# Patient Record
Sex: Female | Born: 1980
Health system: Southern US, Community
[De-identification: ages and names within clinical notes are randomized; demographics above are authoritative.]

## PROBLEM LIST (undated history)

## (undated) DIAGNOSIS — E282 Polycystic ovarian syndrome: Secondary | ICD-10-CM

## (undated) DIAGNOSIS — F419 Anxiety disorder, unspecified: Secondary | ICD-10-CM

## (undated) DIAGNOSIS — R42 Dizziness and giddiness: Secondary | ICD-10-CM

## (undated) DIAGNOSIS — R7989 Other specified abnormal findings of blood chemistry: Secondary | ICD-10-CM

## (undated) DIAGNOSIS — F329 Major depressive disorder, single episode, unspecified: Secondary | ICD-10-CM

## (undated) DIAGNOSIS — M549 Dorsalgia, unspecified: Secondary | ICD-10-CM

## (undated) DIAGNOSIS — F32A Depression, unspecified: Secondary | ICD-10-CM

## (undated) DIAGNOSIS — J302 Other seasonal allergic rhinitis: Secondary | ICD-10-CM

## (undated) DIAGNOSIS — R7303 Prediabetes: Secondary | ICD-10-CM

## (undated) DIAGNOSIS — E669 Obesity, unspecified: Secondary | ICD-10-CM

## (undated) DIAGNOSIS — I10 Essential (primary) hypertension: Secondary | ICD-10-CM

## (undated) DIAGNOSIS — T7840XA Allergy, unspecified, initial encounter: Secondary | ICD-10-CM

## (undated) HISTORY — PX: MULTIPLE TOOTH EXTRACTIONS: SHX2053

## (undated) HISTORY — PX: WISDOM TOOTH EXTRACTION: SHX21

## (undated) HISTORY — DX: Depression, unspecified: F32.A

## (undated) HISTORY — DX: Other specified abnormal findings of blood chemistry: R79.89

## (undated) HISTORY — DX: Obesity, unspecified: E66.9

## (undated) HISTORY — DX: Major depressive disorder, single episode, unspecified: F32.9

## (undated) HISTORY — DX: Prediabetes: R73.03

## (undated) HISTORY — DX: Allergy, unspecified, initial encounter: T78.40XA

## (undated) HISTORY — DX: Anxiety disorder, unspecified: F41.9

## (undated) HISTORY — DX: Dizziness and giddiness: R42

## (undated) HISTORY — PX: OTHER SURGICAL HISTORY: SHX169

## (undated) HISTORY — DX: Polycystic ovarian syndrome: E28.2

---
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
## 1999-04-08 ENCOUNTER — Encounter: Admission: RE | Admit: 1999-04-08 | Discharge: 1999-04-08 | Payer: Self-pay | Admitting: Family Medicine

## 1999-04-29 ENCOUNTER — Encounter: Admission: RE | Admit: 1999-04-29 | Discharge: 1999-04-29 | Payer: Self-pay | Admitting: Family Medicine

## 1999-05-16 ENCOUNTER — Encounter (INDEPENDENT_AMBULATORY_CARE_PROVIDER_SITE_OTHER): Payer: Self-pay | Admitting: *Deleted

## 1999-05-16 LAB — CONVERTED CEMR LAB

## 1999-05-20 ENCOUNTER — Encounter: Admission: RE | Admit: 1999-05-20 | Discharge: 1999-05-20 | Payer: Self-pay | Admitting: Family Medicine

## 1999-10-22 ENCOUNTER — Encounter: Admission: RE | Admit: 1999-10-22 | Discharge: 1999-10-22 | Payer: Self-pay | Admitting: Family Medicine

## 1999-10-29 ENCOUNTER — Encounter: Admission: RE | Admit: 1999-10-29 | Discharge: 1999-10-29 | Payer: Self-pay | Admitting: Family Medicine

## 1999-12-30 ENCOUNTER — Encounter: Admission: RE | Admit: 1999-12-30 | Discharge: 1999-12-30 | Payer: Self-pay | Admitting: Family Medicine

## 2000-02-17 ENCOUNTER — Encounter: Admission: RE | Admit: 2000-02-17 | Discharge: 2000-02-17 | Payer: Self-pay | Admitting: Family Medicine

## 2000-04-08 ENCOUNTER — Emergency Department (HOSPITAL_COMMUNITY): Admission: EM | Admit: 2000-04-08 | Discharge: 2000-04-08 | Payer: Self-pay | Admitting: Emergency Medicine

## 2000-06-23 ENCOUNTER — Emergency Department (HOSPITAL_COMMUNITY): Admission: EM | Admit: 2000-06-23 | Discharge: 2000-06-23 | Payer: Self-pay | Admitting: Emergency Medicine

## 2000-07-08 ENCOUNTER — Encounter: Admission: RE | Admit: 2000-07-08 | Discharge: 2000-07-08 | Payer: Self-pay | Admitting: Family Medicine

## 2000-07-19 ENCOUNTER — Encounter: Admission: RE | Admit: 2000-07-19 | Discharge: 2000-07-19 | Payer: Self-pay | Admitting: Family Medicine

## 2000-08-02 ENCOUNTER — Encounter: Admission: RE | Admit: 2000-08-02 | Discharge: 2000-08-02 | Payer: Self-pay | Admitting: Family Medicine

## 2000-08-10 ENCOUNTER — Encounter: Admission: RE | Admit: 2000-08-10 | Discharge: 2000-08-10 | Payer: Self-pay | Admitting: Family Medicine

## 2000-08-15 ENCOUNTER — Encounter: Payer: Self-pay | Admitting: Family Medicine

## 2000-08-15 ENCOUNTER — Encounter: Admission: RE | Admit: 2000-08-15 | Discharge: 2000-08-15 | Payer: Self-pay | Admitting: Family Medicine

## 2000-08-30 ENCOUNTER — Encounter: Admission: RE | Admit: 2000-08-30 | Discharge: 2000-08-30 | Payer: Self-pay | Admitting: Family Medicine

## 2000-11-28 ENCOUNTER — Encounter: Admission: RE | Admit: 2000-11-28 | Discharge: 2000-12-15 | Payer: Self-pay | Admitting: Orthopedic Surgery

## 2001-01-09 ENCOUNTER — Other Ambulatory Visit: Admission: RE | Admit: 2001-01-09 | Discharge: 2001-01-09 | Payer: Self-pay | Admitting: Obstetrics and Gynecology

## 2001-03-19 ENCOUNTER — Emergency Department (HOSPITAL_COMMUNITY): Admission: EM | Admit: 2001-03-19 | Discharge: 2001-03-19 | Payer: Self-pay | Admitting: Emergency Medicine

## 2001-03-20 ENCOUNTER — Emergency Department (HOSPITAL_COMMUNITY): Admission: EM | Admit: 2001-03-20 | Discharge: 2001-03-20 | Payer: Self-pay | Admitting: Emergency Medicine

## 2001-08-31 ENCOUNTER — Encounter: Payer: Self-pay | Admitting: Obstetrics and Gynecology

## 2001-08-31 ENCOUNTER — Ambulatory Visit (HOSPITAL_COMMUNITY): Admission: RE | Admit: 2001-08-31 | Discharge: 2001-08-31 | Payer: Self-pay | Admitting: Obstetrics and Gynecology

## 2001-10-31 ENCOUNTER — Encounter: Admission: RE | Admit: 2001-10-31 | Discharge: 2002-01-29 | Payer: Self-pay | Admitting: Obstetrics and Gynecology

## 2001-12-06 ENCOUNTER — Inpatient Hospital Stay (HOSPITAL_COMMUNITY): Admission: AD | Admit: 2001-12-06 | Discharge: 2001-12-06 | Payer: Self-pay | Admitting: Obstetrics and Gynecology

## 2001-12-21 ENCOUNTER — Inpatient Hospital Stay (HOSPITAL_COMMUNITY): Admission: AD | Admit: 2001-12-21 | Discharge: 2001-12-23 | Payer: Self-pay | Admitting: Obstetrics and Gynecology

## 2002-01-24 ENCOUNTER — Other Ambulatory Visit: Admission: RE | Admit: 2002-01-24 | Discharge: 2002-01-24 | Payer: Self-pay | Admitting: Obstetrics and Gynecology

## 2002-03-28 ENCOUNTER — Emergency Department (HOSPITAL_COMMUNITY): Admission: EM | Admit: 2002-03-28 | Discharge: 2002-03-28 | Payer: Self-pay | Admitting: Emergency Medicine

## 2002-03-28 ENCOUNTER — Encounter: Payer: Self-pay | Admitting: Emergency Medicine

## 2003-03-07 ENCOUNTER — Other Ambulatory Visit: Admission: RE | Admit: 2003-03-07 | Discharge: 2003-03-07 | Payer: Self-pay | Admitting: Obstetrics and Gynecology

## 2003-07-08 ENCOUNTER — Emergency Department (HOSPITAL_COMMUNITY): Admission: EM | Admit: 2003-07-08 | Discharge: 2003-07-08 | Payer: Self-pay | Admitting: Emergency Medicine

## 2004-04-28 ENCOUNTER — Emergency Department (HOSPITAL_COMMUNITY): Admission: EM | Admit: 2004-04-28 | Discharge: 2004-04-28 | Payer: Self-pay | Admitting: Emergency Medicine

## 2004-06-26 ENCOUNTER — Other Ambulatory Visit: Admission: RE | Admit: 2004-06-26 | Discharge: 2004-06-26 | Payer: Self-pay | Admitting: Obstetrics and Gynecology

## 2005-02-21 ENCOUNTER — Emergency Department (HOSPITAL_COMMUNITY): Admission: EM | Admit: 2005-02-21 | Discharge: 2005-02-21 | Payer: Self-pay | Admitting: Family Medicine

## 2005-09-03 ENCOUNTER — Other Ambulatory Visit: Admission: RE | Admit: 2005-09-03 | Discharge: 2005-09-03 | Payer: Self-pay | Admitting: Obstetrics and Gynecology

## 2006-03-05 ENCOUNTER — Emergency Department (HOSPITAL_COMMUNITY): Admission: EM | Admit: 2006-03-05 | Discharge: 2006-03-05 | Payer: Self-pay | Admitting: Family Medicine

## 2006-06-08 ENCOUNTER — Emergency Department (HOSPITAL_COMMUNITY): Admission: EM | Admit: 2006-06-08 | Discharge: 2006-06-08 | Payer: Self-pay | Admitting: Family Medicine

## 2006-08-24 ENCOUNTER — Emergency Department (HOSPITAL_COMMUNITY): Admission: EM | Admit: 2006-08-24 | Discharge: 2006-08-24 | Payer: Self-pay | Admitting: Family Medicine

## 2006-12-24 ENCOUNTER — Emergency Department (HOSPITAL_COMMUNITY): Admission: EM | Admit: 2006-12-24 | Discharge: 2006-12-24 | Payer: Self-pay | Admitting: Family Medicine

## 2007-01-13 ENCOUNTER — Encounter (INDEPENDENT_AMBULATORY_CARE_PROVIDER_SITE_OTHER): Payer: Self-pay | Admitting: *Deleted

## 2007-05-03 ENCOUNTER — Emergency Department (HOSPITAL_COMMUNITY): Admission: EM | Admit: 2007-05-03 | Discharge: 2007-05-03 | Payer: Self-pay | Admitting: Family Medicine

## 2007-08-31 ENCOUNTER — Encounter: Admission: RE | Admit: 2007-08-31 | Discharge: 2007-08-31 | Payer: Self-pay | Admitting: Obstetrics and Gynecology

## 2007-10-27 ENCOUNTER — Encounter: Admission: RE | Admit: 2007-10-27 | Discharge: 2007-10-28 | Payer: Self-pay | Admitting: Obstetrics and Gynecology

## 2008-02-15 ENCOUNTER — Inpatient Hospital Stay (HOSPITAL_COMMUNITY): Admission: AD | Admit: 2008-02-15 | Discharge: 2008-02-15 | Payer: Self-pay | Admitting: Internal Medicine

## 2008-03-14 ENCOUNTER — Inpatient Hospital Stay (HOSPITAL_COMMUNITY): Admission: AD | Admit: 2008-03-14 | Discharge: 2008-03-14 | Payer: Self-pay | Admitting: Obstetrics and Gynecology

## 2011-05-24 ENCOUNTER — Emergency Department: Payer: Self-pay | Admitting: *Deleted

## 2011-07-07 ENCOUNTER — Encounter: Payer: Self-pay | Admitting: Family Medicine

## 2011-07-07 ENCOUNTER — Ambulatory Visit (INDEPENDENT_AMBULATORY_CARE_PROVIDER_SITE_OTHER): Payer: 59 | Admitting: Family Medicine

## 2011-07-07 DIAGNOSIS — R42 Dizziness and giddiness: Secondary | ICD-10-CM

## 2011-07-07 DIAGNOSIS — F419 Anxiety disorder, unspecified: Secondary | ICD-10-CM | POA: Insufficient documentation

## 2011-07-07 DIAGNOSIS — R030 Elevated blood-pressure reading, without diagnosis of hypertension: Secondary | ICD-10-CM | POA: Insufficient documentation

## 2011-07-07 DIAGNOSIS — E282 Polycystic ovarian syndrome: Secondary | ICD-10-CM

## 2011-07-07 DIAGNOSIS — J4520 Mild intermittent asthma, uncomplicated: Secondary | ICD-10-CM | POA: Insufficient documentation

## 2011-07-07 DIAGNOSIS — F411 Generalized anxiety disorder: Secondary | ICD-10-CM

## 2011-07-07 DIAGNOSIS — Z23 Encounter for immunization: Secondary | ICD-10-CM

## 2011-07-07 DIAGNOSIS — J45909 Unspecified asthma, uncomplicated: Secondary | ICD-10-CM

## 2011-07-07 MED ORDER — MECLIZINE HCL 25 MG PO TABS: 25.0000 mg | ORAL_TABLET | Freq: Three times a day (TID) | ORAL | Status: DC | PRN

## 2011-07-07 MED ORDER — ALBUTEROL SULFATE HFA 108 (90 BASE) MCG/ACT IN AERS: 2.0000 | INHALATION_SPRAY | Freq: Four times a day (QID) | RESPIRATORY_TRACT | Status: DC | PRN

## 2011-07-07 NOTE — Patient Instructions (Signed)
Great job on your [employer] loss!  Continue your exercise and good nutrition! We have a PhD nutritionist here in the office in our office.

## 2011-07-08 ENCOUNTER — Encounter: Payer: Self-pay | Admitting: Family Medicine

## 2011-07-08 NOTE — Assessment & Plan Note (Signed)
No evidence of serious etiology per history and her previous specialty evaluation.  Ok with intermittent meclizine.

## 2011-07-08 NOTE — Assessment & Plan Note (Signed)
Dr. Vincente Poli, obgyn.  Failed control of elevated testosterone with Metformin.  On Byetta.  No evidence of glucose intolerance or dyslipidemia.

## 2011-07-08 NOTE — Assessment & Plan Note (Signed)
Rare use of albuterol without exercise induced symptoms.  Ok to continue prn use.

## 2011-07-08 NOTE — Assessment & Plan Note (Addendum)
Has appointment with Dr. Dub Mikes for treatment resistant anxiety, panic attacks, depression.  Discussed I would be ok to refill clonazepam once if needed until able to be seen by psychiatry.  She will let me know.

## 2011-07-08 NOTE — Assessment & Plan Note (Signed)
Managing with weight loss.  Has had success with this.  Will continue to monitor.
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
## 2011-07-28 ENCOUNTER — Emergency Department (HOSPITAL_COMMUNITY)
Admission: EM | Admit: 2011-07-28 | Discharge: 2011-07-28 | Disposition: A | Discharge status: Home / Self Care | Payer: 59 | Attending: Emergency Medicine | Admitting: Emergency Medicine

## 2011-07-28 DIAGNOSIS — R51 Headache: Secondary | ICD-10-CM | POA: Insufficient documentation

## 2011-07-28 DIAGNOSIS — N390 Urinary tract infection, site not specified: Secondary | ICD-10-CM | POA: Insufficient documentation

## 2011-07-28 LAB — URINALYSIS, ROUTINE W REFLEX MICROSCOPIC
Glucose, UA: NEGATIVE mg/dL
Ketones, ur: NEGATIVE mg/dL
Nitrite: NEGATIVE
Specific Gravity, Urine: 1.011 (ref 1.005–1.030)
pH: 6 (ref 5.0–8.0)

## 2011-07-28 LAB — URINE MICROSCOPIC-ADD ON

## 2011-07-28 LAB — POCT PREGNANCY, URINE: Preg Test, Ur: NEGATIVE

## 2011-07-29 LAB — URINE CULTURE

## 2011-10-06 ENCOUNTER — Other Ambulatory Visit: Payer: Self-pay | Admitting: Family Medicine

## 2011-10-06 NOTE — Telephone Encounter (Signed)
Refill request

## 2011-10-13 ENCOUNTER — Ambulatory Visit (INDEPENDENT_AMBULATORY_CARE_PROVIDER_SITE_OTHER): Payer: 59 | Admitting: Family Medicine

## 2011-10-13 DIAGNOSIS — J329 Chronic sinusitis, unspecified: Secondary | ICD-10-CM | POA: Insufficient documentation

## 2011-10-13 DIAGNOSIS — R42 Dizziness and giddiness: Secondary | ICD-10-CM

## 2011-10-13 DIAGNOSIS — J309 Allergic rhinitis, unspecified: Secondary | ICD-10-CM

## 2011-10-13 MED ORDER — FLUTICASONE PROPIONATE 50 MCG/ACT NA SUSP: NASAL | Status: DC

## 2011-10-13 MED ORDER — MECLIZINE HCL 25 MG PO TABS: 25.0000 mg | ORAL_TABLET | Freq: Three times a day (TID) | ORAL | Status: DC | PRN

## 2011-10-13 MED ORDER — CETIRIZINE HCL 10 MG PO TABS: 10.0000 mg | ORAL_TABLET | Freq: Every day | ORAL | Status: DC

## 2011-10-13 NOTE — Patient Instructions (Signed)
Allergic Rhinitis Allergic rhinitis is when the mucous membranes in the nose respond to allergens. Allergens are particles in the air that cause your body to have an allergic reaction. This causes you to release allergic antibodies. Through a chain of events, these eventually cause you to release histamine into the blood stream (hence the use of antihistamines). Although meant to be protective to the body, it is this release that causes your discomfort, such as frequent sneezing, congestion and an itchy runny nose.  CAUSES  The pollen allergens may come from grasses, trees, and weeds. This is seasonal allergic rhinitis, or "hay fever." Other allergens cause year-round allergic rhinitis (perennial allergic rhinitis) such as house dust mite allergen, pet dander and mold spores.  SYMPTOMS   Nasal stuffiness (congestion).   Runny, itchy nose with sneezing and tearing of the eyes.   There is often an itching of the mouth, eyes and ears.  It cannot be cured, but it can be controlled with medications. DIAGNOSIS  If you are unable to determine the offending allergen, skin or blood testing may find it. TREATMENT   Avoid the allergen.   Medications and allergy shots (immunotherapy) can help.   Hay fever may often be treated with antihistamines in pill or nasal spray forms. Antihistamines block the effects of histamine. There are over-the-counter medicines that may help with nasal congestion and swelling around the eyes. Check with your caregiver before taking or giving this medicine.  If the treatment above does not work, there are many new medications your caregiver can prescribe. Stronger medications may be used if initial measures are ineffective. Desensitizing injections can be used if medications and avoidance fails. Desensitization is when a patient is given ongoing shots until the body becomes less sensitive to the allergen. Make sure you follow up with your caregiver if problems continue. SEEK  MEDICAL CARE IF:   You develop fever (more than 100.5 F (38.1 C).   You develop a cough that does not stop easily (persistent).   You have shortness of breath.   You start wheezing.   Symptoms interfere with normal daily activities.  Document Released: 07/27/2001 Document Revised: 07/14/2011 Document Reviewed: 02/05/2009 [facility] Patient Information 2012 [place], [place].  Sinusitis Sinuses are air pockets within the bones of your face. The growth of bacteria within a sinus leads to infection. The infection prevents the sinuses from draining. This infection is called sinusitis. SYMPTOMS  There will be different areas of pain depending on which sinuses have become infected.  The maxillary sinuses often produce pain beneath the eyes.   Frontal sinusitis may cause pain in the middle of the forehead and above the eyes.  Other problems (symptoms) include:  Toothaches.   Colored, pus-like (purulent) drainage from the nose.   Swelling, warmth, and tenderness over the sinus areas may be signs of infection.  TREATMENT  Sinusitis is most often determined by an exam.X-rays may be taken. If x-rays have been taken, make sure you obtain your results or find out how you are to obtain them. Your caregiver may give you medications (antibiotics). These are medications that will help kill the bacteria causing the infection. You may also be given a medication (decongestant) that helps to reduce sinus swelling.  HOME CARE INSTRUCTIONS   Only take over-the-counter or prescription medicines for pain, discomfort, or fever as directed by your caregiver.   Drink extra fluids. Fluids help thin the mucus so your sinuses can drain more easily.   Applying either moist heat or  ice packs to the sinus areas may help relieve discomfort.   Use saline nasal sprays to help moisten your sinuses. The sprays can be found at your local drugstore.  SEEK IMMEDIATE MEDICAL CARE IF:  You have a fever.   You have  increasing pain, severe headaches, or toothache.   You have nausea, vomiting, or drowsiness.   You develop unusual swelling around the face or trouble seeing.  MAKE SURE YOU:   Understand these instructions.   Will watch your condition.   Will get help right away if you are not doing well or get worse.  Document Released: 11/01/2005 Document Revised: 07/14/2011 Document Reviewed: 05/31/2007 [facility] Patient Information 2012 [place], [place].

## 2011-10-13 NOTE — Progress Notes (Signed)
  Subjective:    Patient ID: Amanda Massey, female    DOB: [birth year], 30 y.o.   MRN: [id number]  HPI Upper respiratory symptoms x3 weeks. Patient states she's had nasal congestion, rhinorrhea, postnasal drip, cough over last 3 weeks. Patient states that nasal congestion, postnasal drip, and sinus pressure have persisted over this time. Patient states she has a baseline history of allergies as well as intermittent nasal congestion that seems to worsen around the spring and fall. Patient states she's been using Mucinex with minimal relief in symptoms. Patient is a nonsmoker.  Patient also reports a baseline history of intermittent asthma to which patient uses when necessary albuterol for this. Patient states she's been using albuterol once twice daily over the same time. With minimal relief in cough or subjective wheezing.   Review of Systems See history of present illness, otherwise 12 point review systems negative.    Objective:   Physical Exam General: Up in bed NAD HEENT: Positive right ear cerumen impaction, tympanic membranes clear bilaterally status post cerumen removal, positive nasal erythema and mucosal edema, positive posterior oropharyngeal erythema. Cardiovascular: Regular rate and rhythm, no murmurs Pulmonary, clear to auscultation bilaterally, no wheezing auscultated.   Assessment & Plan:

## 2011-10-17 ENCOUNTER — Encounter: Payer: Self-pay | Admitting: Family Medicine

## 2011-10-17 NOTE — Assessment & Plan Note (Signed)
Acute flare of baseline vertigo by ear irrigation. Pt able to ambulate s/p irrigation and desires to go home.  Antivert rx refilled. Vestibular red flags reviewed.

## 2011-10-17 NOTE — Assessment & Plan Note (Signed)
Overall exam and hx consistent with allergic rhinitis exacerbated by underlying viral URI. Will treat with flonase and zyrtec. Infectious red flags discussed.

## 2011-11-21 ENCOUNTER — Emergency Department (HOSPITAL_COMMUNITY)
Admission: EM | Admit: 2011-11-21 | Discharge: 2011-11-21 | Disposition: A | Discharge status: Home / Self Care | Payer: 59 | Source: Home / Self Care | Attending: Family Medicine | Admitting: Family Medicine

## 2011-11-21 ENCOUNTER — Encounter (HOSPITAL_COMMUNITY): Payer: Self-pay | Admitting: *Deleted

## 2011-11-21 DIAGNOSIS — J45909 Unspecified asthma, uncomplicated: Secondary | ICD-10-CM

## 2011-11-21 MED ORDER — FLUTICASONE-SALMETEROL 100-50 MCG/DOSE IN AEPB: 1.0000 | INHALATION_SPRAY | Freq: Two times a day (BID) | RESPIRATORY_TRACT | Status: DC

## 2011-11-21 MED ORDER — METHYLPREDNISOLONE SODIUM SUCC 125 MG IJ SOLR
125.0000 mg | Freq: Once | INTRAMUSCULAR | Status: AC
  Administered 2011-11-21: 125 mg via INTRAMUSCULAR

## 2011-11-21 MED ORDER — METHYLPREDNISOLONE SODIUM SUCC 125 MG IJ SOLR
INTRAMUSCULAR | Status: AC
  Filled 2011-11-21: qty 2

## 2011-11-21 MED ORDER — ALBUTEROL SULFATE (5 MG/ML) 0.5% IN NEBU
5.0000 mg | INHALATION_SOLUTION | Freq: Once | RESPIRATORY_TRACT | Status: AC
  Administered 2011-11-21: 5 mg via RESPIRATORY_TRACT

## 2011-11-21 MED ORDER — ALBUTEROL SULFATE (5 MG/ML) 0.5% IN NEBU
INHALATION_SOLUTION | RESPIRATORY_TRACT | Status: AC
Start: 2011-11-21 — End: 2011-11-21
  Filled 2011-11-21: qty 1

## 2011-11-21 MED ORDER — IPRATROPIUM BROMIDE 0.02 % IN SOLN
0.5000 mg | Freq: Once | RESPIRATORY_TRACT | Status: AC
  Administered 2011-11-21: 0.5 mg via RESPIRATORY_TRACT

## 2011-11-21 MED ORDER — ALBUTEROL SULFATE HFA 108 (90 BASE) MCG/ACT IN AERS: 1.0000 | INHALATION_SPRAY | Freq: Four times a day (QID) | RESPIRATORY_TRACT | Status: DC | PRN

## 2011-11-21 NOTE — ED Notes (Signed)
Pre treatment peak flow 230  Post treatment 300

## 2011-11-21 NOTE — ED Notes (Signed)
Pt with onset of increased sob and wheezing last night - worse today - used alb;uterol inhaler without relief

## 2011-11-21 NOTE — ED Provider Notes (Signed)
History     CSN: [id number]  Arrival date & time 11/21/11  1718   First MD Initiated Contact with Patient 11/21/11 1719      Chief Complaint  Patient presents with  . Wheezing  . Shortness of Breath    (Consider location/radiation/quality/duration/timing/severity/associated sxs/prior treatment) Patient is a 31 y.o. female presenting with wheezing and shortness of breath. The history is provided by the patient and the spouse.  Wheezing  The current episode started yesterday. The problem has been gradually worsening. The problem is moderate. The symptoms are relieved by nothing. The symptoms are aggravated by nothing. Associated symptoms include shortness of breath and wheezing. Pertinent negatives include no fever and no cough. She has not inhaled smoke recently. Her past medical history is significant for asthma. She has been behaving normally.  Shortness of Breath  Associated symptoms include shortness of breath and wheezing. Pertinent negatives include no fever and no cough. Her past medical history is significant for asthma.    Past Medical History  Diagnosis Date  . PCOS (polycystic ovarian syndrome)   . Anxiety   . Asthma   . Vertigo     History reviewed. No pertinent past surgical history.  Family History  Problem Relation Age of Onset  . Parkinsonism Father   . Cancer Neg Hx   . Alcohol abuse Neg Hx   . Depression Neg Hx   . Diabetes Neg Hx   . Hypertension Neg Hx   . Hyperlipidemia Neg Hx     History  Substance Use Topics  . Smoking status: Never Smoker   . Smokeless tobacco: Not on file  . Alcohol Use: No    OB History    Grav Para Term Preterm Abortions TAB SAB Ect Mult Living                  Review of Systems  Constitutional: Negative.  Negative for fever.  HENT: Negative.   Respiratory: Positive for shortness of breath and wheezing. Negative for cough.   Gastrointestinal: Negative.   Skin: Negative.     Allergies  Review of patient's  allergies indicates no known allergies.  Home Medications   Current Outpatient Rx  Name Route Sig Dispense Refill  . B COMPLEX PO TABS Oral Take 1 tablet by mouth daily.      . BUSPIRONE HCL 15 MG PO TABS Oral Take 15 mg by mouth 3 (three) times daily.      Marland Kitchen CETIRIZINE HCL 10 MG PO TABS Oral Take 1 tablet (10 mg total) by mouth daily. 30 tablet 6  . VITAMIN D 1000 UNITS PO CAPS Oral Take 5,000 Units by mouth daily.      Marland Kitchen CITALOPRAM HYDROBROMIDE 20 MG PO TABS Oral Take 30 mg by mouth daily.      Marland Kitchen CLONAZEPAM 1 MG PO TABS Oral Take 1 mg by mouth 3 (three) times daily as needed.      Marland Kitchen EXENATIDE 10 MCG/0.04ML [place] SOLN Subcutaneous Inject 20 mcg into the skin 2 (two) times daily with a meal.     . FLUTICASONE PROPIONATE 50 MCG/ACT NA SUSP  2 sprays into each nostril daily 16 g 2  . MECLIZINE HCL 25 MG PO TABS Oral Take 1 tablet (25 mg total) by mouth 3 (three) times daily as needed. 30 tablet 1  . ONE-DAILY MULTI VITAMINS PO TABS Oral Take 1 tablet by mouth daily.      . VENTOLIN HFA 108 (90 BASE) MCG/ACT IN AERS  INHALE 2 PUFFS INTO THE LUNGS EVERY 6 (SIX) HOURS AS NEEDED. 18 each 2  . ALBUTEROL SULFATE HFA 108 (90 BASE) MCG/ACT IN AERS Inhalation Inhale 1-2 puffs into the lungs every 6 (six) hours as needed for wheezing. 1 Inhaler 0  . CELERY SEED OIL Does not apply by Does not apply route.      Marland Kitchen FLUTICASONE-SALMETEROL 100-50 MCG/DOSE IN AEPB Inhalation Inhale 1 puff into the lungs 2 (two) times daily. 60 each 1  . PROGESTERONE MICRONIZED 200 MG PO CAPS Oral Take 200 mg by mouth daily. 10 days monthly.  Dr. Vincente Poli       BP 125/84  Pulse 100  Temp(Src) 97.8 F (36.6 C) (Oral)  Resp 25  SpO2 98%  LMP 10/23/2011  Physical Exam  Nursing note and vitals reviewed. Constitutional: She appears well-developed and well-nourished.  HENT:  Head: Normocephalic.  Right Ear: External ear normal.  Left Ear: External ear normal.  Nose: Nose normal.  Mouth/Throat: Oropharynx is clear and moist.    Eyes: Conjunctivae and EOM are normal. Pupils are equal, round, and reactive to light.  Neck: Normal range of motion. Neck supple.  Cardiovascular: Normal rate, normal heart sounds and intact distal pulses.   Pulmonary/Chest: She has wheezes in the right upper field, the right middle field, the right lower field, the left upper field, the left middle field and the left lower field.  Lymphadenopathy:    She has no cervical adenopathy.  Skin: Skin is warm and dry.  Psychiatric: Her mood appears anxious.    ED Course  Procedures (including critical care time)  Labs Reviewed - No data to display No results found.   1. Asthma, acute       MDM  Initial pre peak flow 230., post initial neb was 300. At 350 at disch., lungs clear.        Barkley Bruns, MD 11/21/11 [phone]

## 2011-12-21 ENCOUNTER — Ambulatory Visit: Payer: 59 | Admitting: Family Medicine

## 2011-12-22 ENCOUNTER — Ambulatory Visit: Payer: 59 | Admitting: Family Medicine

## 2011-12-22 ENCOUNTER — Encounter: Payer: Self-pay | Admitting: Family Medicine

## 2011-12-22 ENCOUNTER — Ambulatory Visit (INDEPENDENT_AMBULATORY_CARE_PROVIDER_SITE_OTHER): Payer: 59 | Admitting: Family Medicine

## 2011-12-22 VITALS — BP 128/79 | HR 94 | Temp 97.6°F | Ht 66.0 in | Wt 296.0 lb

## 2011-12-22 DIAGNOSIS — J45909 Unspecified asthma, uncomplicated: Secondary | ICD-10-CM

## 2011-12-22 DIAGNOSIS — J4520 Mild intermittent asthma, uncomplicated: Secondary | ICD-10-CM

## 2011-12-22 MED ORDER — FLUTICASONE-SALMETEROL 100-50 MCG/DOSE IN AEPB: 1.0000 | INHALATION_SPRAY | Freq: Two times a day (BID) | RESPIRATORY_TRACT | Status: DC

## 2011-12-22 NOTE — Assessment & Plan Note (Addendum)
Patient appears to be doing fairly well overall. Patient has Advair as well as albuterol. Continue same regimen at this time patient only has a flare about one time a year. If I was to make any changes it would be increase her Advair to 250/50 at this time do not feel I need to. Discussed about Price with patient and the potential switch to Qvar or Flovent the patient declined at this time.

## 2011-12-22 NOTE — Progress Notes (Signed)
  Subjective:    Patient ID: Amanda Massey, female    DOB: 21-Jul-1981, 31 y.o.   MRN: [id number]  HPI Patient is here for medication refill. Patient was seen in urgent care one month ago for what seemed to be an asthma exacerbation. She was treated with nebulizers given Advair and seemed to improve dramatically. Patient had this for the last month now and has had no problems with breathing and is using her albuterol inhaler maybe one time a week. Patient is very happy with the medication except the Price but because it helping her she would like to continue it through at least this season. Denies any palpitations tachycardia shortness of breath or chest pain.  Anxiety/depression-patient continued to Dr. Dub Mikes for these medications.   Review of Systems As stated in the history of present illness including patient denies fever and chills    Objective:   Physical Exam  Vitals reviewed. Constitutional:       Obese  Neck: Normal range of motion.  Cardiovascular: Normal rate, regular rhythm and normal heart sounds.   Pulmonary/Chest: Effort normal and breath sounds normal. No respiratory distress. She has no wheezes.  Abdominal: Soft. She exhibits no distension. There is no tenderness.  Skin: Skin is warm and dry.  Psychiatric: She has a normal mood and affect.          Assessment & Plan:

## 2011-12-22 NOTE — Patient Instructions (Signed)
Very nice to meet you. I would continue your Zyrtec for your allergies. I refilled your Advair today and would continue it through April at least and then we can discuss weaning off. If you need anything else please don't hesitate to call.

## 2011-12-23 ENCOUNTER — Encounter: Payer: 59 | Attending: Family Medicine | Admitting: *Deleted

## 2011-12-23 ENCOUNTER — Encounter: Payer: Self-pay | Admitting: *Deleted

## 2011-12-23 DIAGNOSIS — Z713 Dietary counseling and surveillance: Secondary | ICD-10-CM | POA: Insufficient documentation

## 2011-12-23 DIAGNOSIS — R7309 Other abnormal glucose: Secondary | ICD-10-CM | POA: Insufficient documentation

## 2011-12-23 NOTE — Progress Notes (Signed)
Medical Nutrition Therapy:  Appt start time: 1600 end time:  1700.  Assessment:  Prediabetes. Pt with A1c of 5.9% and BMI of 48.5 kg/m^2 here through [facility] for assessment. Reports wt of 280 lbs in 08/2011 after losing 60 lbs with diet and exercise, but has gained back 20 lbs.  Consumes light meals for breakfast and lunch, but has "whatever" for dinner including fast food or take out. Excessive CHO intake noted during these meals.  Pt has stopped exercising as well. PMH of DM reported.  No pain at this time.  TANITA  BODY COMP RESULTS %Fat: 50.5% FM:  151.0 lbs FFM:  148.5 lbs TBW: 108.5 lbs  MEDICATIONS: See medication list; reconciled with pt.   DIETARY INTAKE:  Usual eating pattern includes 3 meals and 0-2 snacks per day.  24-hr recall:  B ( AM): [employer] brand Slim fast  Snk ( AM): none  L ( PM): Veggie plate (at [facility] cafe) Snk ( PM): none D ( PM): "Whatever" - [employer] frozen Pizza (3-4 pcs), double strawberry ice cream  Snk ( PM): none  Usual physical activity: None  Estimated energy needs: 1500 calories 165-170 g carbohydrates 110-115 g protein 40-42 g fat  Progress Towards Goal(s):  In progress.   Nutritional Diagnosis:  [place]-2.2 Altered nutrition-related laboratory related to glucose metabolism as evidenced by recent A1c of 5.9% and referral for DM education.    Intervention/Goals:  Follow Prediabetes Meal Plan (see yellow card). Focus on carb portions for the first month.  Add protein rich foods to all meals and snacks.  Choose more whole grains, lean protein, low-fat dairy, and fruits/non-starchy vegetables.   Aim for >30 min of physical activity daily.  Limit sugar-sweetened beverages, concentrated sweets, and meals away from home.  Monitoring/Evaluation:  Dietary intake, exercise, A1c, and body weight prn (per pt request).

## 2011-12-23 NOTE — Patient Instructions (Addendum)
Goals:  Follow Prediabetes Meal Plan (see yellow card). Focus on carb portions for the first month.  Add protein rich foods to all meals and snacks.  Choose more whole grains, lean protein, low-fat dairy, and fruits/non-starchy vegetables.   Aim for >30 min of physical activity daily.  Limit sugar-sweetened beverages, concentrated sweets, and meals away from home.

## 2011-12-24 ENCOUNTER — Encounter: Payer: Self-pay | Admitting: *Deleted

## 2012-02-14 ENCOUNTER — Ambulatory Visit: Payer: 59 | Admitting: *Deleted

## 2012-02-28 ENCOUNTER — Encounter: Payer: Self-pay | Admitting: *Deleted

## 2012-02-28 ENCOUNTER — Encounter: Payer: 59 | Attending: Family Medicine | Admitting: *Deleted

## 2012-02-28 VITALS — Ht 66.0 in | Wt 295.0 lb

## 2012-02-28 DIAGNOSIS — Z713 Dietary counseling and surveillance: Secondary | ICD-10-CM | POA: Insufficient documentation

## 2012-02-28 DIAGNOSIS — E282 Polycystic ovarian syndrome: Secondary | ICD-10-CM

## 2012-02-28 DIAGNOSIS — R7309 Other abnormal glucose: Secondary | ICD-10-CM | POA: Insufficient documentation

## 2012-02-28 NOTE — Patient Instructions (Addendum)
Goals:    Follow modified Gestational Diabetes Diet handout.   **Limit intake to 2000 calories a day (your maintenance) to slow weight gain   Make sure to get the following daily:   Protein: 70 g or more Calcium: 1,000 mg/day  Iron: 27 mg/day   Folate:  600 mcg   Exercise daily and get plenty of fluids!! :)  CONGRATULATIONS!!!!!

## 2012-02-28 NOTE — Progress Notes (Addendum)
Medical Nutrition Therapy:  Appt start time: 1600  end time:  1700.  Assessment:  Prediabetes; Follow up. Pt is here today with news that she is [redacted] weeks pregnant. Had given up sweets for [place] and plans to continue. Has attempted to contact psychiatrist regarding whether she should discontinue klonapin and celexa; has not heard back yet. Has already signed up for the [facility] Healthy Pregnancy program. Pt had GDM with last pregnancy 10 years ago. Discussed GDM diet to keep BGs in control. Mayy have pt follow up with Bev or Maggie to monitor for GDM if diagnosed.   TANITA  BODY COMP RESULTS  12/23/11 02/28/12    %Fat 50.5% 50.9%    FM (lbs) 151.0 150.0    FFM (lbs) 148.5 145.0    TBW (lbs) 108.5 106.0     MEDICATIONS: See medication list; reconciled with pt.   DIETARY INTAKE:  Usual eating pattern includes 3 meals and 0-2 snacks per day.  Usual physical activity: Increased from previous visit.   Estimated energy needs:  TBD  Progress Towards Goal(s):  In progress.   Nutritional Diagnosis:  [place]-2.2 Altered nutrition-related laboratory related to glucose metabolism as evidenced by recent A1c of 5.9% and referral for DM education.    Intervention/Goals:    Follow modified Gestational Diabetes Diet handout.   **Limit intake to 2000 calories a day (your maintenance) to slow weight gain   Make sure to get the following daily:   Protein: 70 g or more Calcium: 1,000 mg/day  Iron: 27 mg/day   Folate:  600 mcg   Exercise daily and get plenty of fluids!! :)  CONGRATULATIONS!!!!!  Monitoring/Evaluation:  Dietary intake, exercise, A1c, and body weight in 6 weeks or prn.

## 2012-03-16 ENCOUNTER — Encounter (HOSPITAL_COMMUNITY): Payer: Self-pay | Admitting: *Deleted

## 2012-03-17 ENCOUNTER — Encounter (HOSPITAL_COMMUNITY): Payer: Self-pay | Admitting: *Deleted

## 2012-03-17 ENCOUNTER — Ambulatory Visit (HOSPITAL_COMMUNITY)
Admission: RE | Admit: 2012-03-17 | Discharge: 2012-03-17 | Disposition: A | Discharge status: Home / Self Care | Payer: 59 | Source: Ambulatory Visit | Attending: Obstetrics and Gynecology | Admitting: Obstetrics and Gynecology

## 2012-03-17 ENCOUNTER — Encounter (HOSPITAL_COMMUNITY): Payer: Self-pay

## 2012-03-17 ENCOUNTER — Ambulatory Visit (HOSPITAL_COMMUNITY): Payer: 59

## 2012-03-17 ENCOUNTER — Encounter (HOSPITAL_COMMUNITY)
Admission: RE | Disposition: A | Discharge status: Home / Self Care | Payer: Self-pay | Source: Ambulatory Visit | Attending: Obstetrics and Gynecology

## 2012-03-17 DIAGNOSIS — F419 Anxiety disorder, unspecified: Secondary | ICD-10-CM

## 2012-03-17 DIAGNOSIS — E282 Polycystic ovarian syndrome: Secondary | ICD-10-CM

## 2012-03-17 DIAGNOSIS — R42 Dizziness and giddiness: Secondary | ICD-10-CM

## 2012-03-17 DIAGNOSIS — J4520 Mild intermittent asthma, uncomplicated: Secondary | ICD-10-CM

## 2012-03-17 DIAGNOSIS — R030 Elevated blood-pressure reading, without diagnosis of hypertension: Secondary | ICD-10-CM

## 2012-03-17 DIAGNOSIS — J329 Chronic sinusitis, unspecified: Secondary | ICD-10-CM

## 2012-03-17 DIAGNOSIS — O021 Missed abortion: Secondary | ICD-10-CM | POA: Insufficient documentation

## 2012-03-17 HISTORY — DX: Other seasonal allergic rhinitis: J30.2

## 2012-03-17 HISTORY — DX: Essential (primary) hypertension: I10

## 2012-03-17 HISTORY — PX: DILATION AND EVACUATION: SHX1459

## 2012-03-17 HISTORY — DX: Dorsalgia, unspecified: M54.9

## 2012-03-17 LAB — CBC
MCH: 29.9 pg (ref 26.0–34.0)
Platelets: 363 10*3/uL (ref 150–400)
RBC: 4.45 MIL/uL (ref 3.87–5.11)

## 2012-03-17 SURGERY — DILATION AND EVACUATION, UTERUS
Anesthesia: General

## 2012-03-17 MED ORDER — FENTANYL CITRATE 0.05 MG/ML IJ SOLN
INTRAMUSCULAR | Status: AC
  Filled 2012-03-17: qty 2

## 2012-03-17 MED ORDER — DEXAMETHASONE SODIUM PHOSPHATE 4 MG/ML IJ SOLN
INTRAMUSCULAR | Status: DC | PRN
  Administered 2012-03-17: 10 mg via INTRAVENOUS

## 2012-03-17 MED ORDER — LACTATED RINGERS IV SOLN
INTRAVENOUS | Status: DC
  Administered 2012-03-17: 125 mL/h via INTRAVENOUS

## 2012-03-17 MED ORDER — LIDOCAINE HCL (CARDIAC) 20 MG/ML IV SOLN
INTRAVENOUS | Status: AC
  Filled 2012-03-17: qty 5

## 2012-03-17 MED ORDER — PROPOFOL 10 MG/ML IV EMUL
INTRAVENOUS | Status: DC | PRN
  Administered 2012-03-17: 300 mg via INTRAVENOUS

## 2012-03-17 MED ORDER — ONDANSETRON HCL 4 MG/2ML IJ SOLN
INTRAMUSCULAR | Status: DC | PRN
  Administered 2012-03-17: 4 mg via INTRAVENOUS

## 2012-03-17 MED ORDER — MIDAZOLAM HCL 2 MG/2ML IJ SOLN
INTRAMUSCULAR | Status: AC
  Filled 2012-03-17: qty 2

## 2012-03-17 MED ORDER — GLYCOPYRROLATE 0.2 MG/ML IJ SOLN
INTRAMUSCULAR | Status: AC
  Filled 2012-03-17: qty 1

## 2012-03-17 MED ORDER — DEXAMETHASONE SODIUM PHOSPHATE 10 MG/ML IJ SOLN
INTRAMUSCULAR | Status: AC
  Filled 2012-03-17: qty 1

## 2012-03-17 MED ORDER — HYDROCODONE-IBUPROFEN 7.5-200 MG PO TABS: 1.0000 | ORAL_TABLET | Freq: Three times a day (TID) | ORAL | Status: AC | PRN

## 2012-03-17 MED ORDER — ONDANSETRON HCL 4 MG/2ML IJ SOLN
INTRAMUSCULAR | Status: AC
  Filled 2012-03-17: qty 2

## 2012-03-17 MED ORDER — LACTATED RINGERS IV SOLN: INTRAVENOUS | Status: DC

## 2012-03-17 MED ORDER — PROPOFOL 10 MG/ML IV EMUL
INTRAVENOUS | Status: AC
  Filled 2012-03-17: qty 40

## 2012-03-17 MED ORDER — LIDOCAINE HCL (CARDIAC) 20 MG/ML IV SOLN
INTRAVENOUS | Status: DC | PRN
  Administered 2012-03-17: 30 mg via INTRAVENOUS

## 2012-03-17 MED ORDER — FENTANYL CITRATE 0.05 MG/ML IJ SOLN
INTRAMUSCULAR | Status: DC | PRN
  Administered 2012-03-17 (×2): 50 ug via INTRAVENOUS
  Administered 2012-03-17: 100 ug via INTRAVENOUS

## 2012-03-17 MED ORDER — DEXTROSE 5 % IV SOLN
2.0000 g | INTRAVENOUS | Status: AC
  Administered 2012-03-17: 2 g via INTRAVENOUS
  Filled 2012-03-17: qty 2

## 2012-03-17 MED ORDER — GLYCOPYRROLATE 0.2 MG/ML IJ SOLN
INTRAMUSCULAR | Status: DC | PRN
  Administered 2012-03-17: 0.2 mg via INTRAVENOUS

## 2012-03-17 MED ORDER — LIDOCAINE HCL 1 % IJ SOLN
INTRAMUSCULAR | Status: DC | PRN
  Administered 2012-03-17: 10 mL

## 2012-03-17 MED ORDER — MIDAZOLAM HCL 5 MG/5ML IJ SOLN
INTRAMUSCULAR | Status: DC | PRN
  Administered 2012-03-17: 2 mg via INTRAVENOUS

## 2012-03-17 MED ORDER — FENTANYL CITRATE 0.05 MG/ML IJ SOLN: 25.0000 ug | INTRAMUSCULAR | Status: DC | PRN

## 2012-03-17 SURGICAL SUPPLY — 19 items
CATH ROBINSON RED A/P 16FR (CATHETERS) ×2 IMPLANT
CLOTH BEACON ORANGE TIMEOUT ST (SAFETY) ×2 IMPLANT
DECANTER SPIKE VIAL GLASS SM (MISCELLANEOUS) ×2 IMPLANT
GLOVE BIO SURGEON STRL SZ 6.5 (GLOVE) ×4 IMPLANT
GOWN PREVENTION PLUS LG XLONG (DISPOSABLE) ×4 IMPLANT
KIT BERKELEY 1ST TRIMESTER 3/8 (MISCELLANEOUS) ×2 IMPLANT
NDL SPNL 22GX3.5 QUINCKE BK (NEEDLE) ×1 IMPLANT
NEEDLE SPNL 22GX3.5 QUINCKE BK (NEEDLE) ×2 IMPLANT
NS IRRIG 1000ML POUR BTL (IV SOLUTION) ×2 IMPLANT
PACK VAGINAL MINOR WOMEN LF (CUSTOM PROCEDURE TRAY) ×2 IMPLANT
PAD PREP 24X48 CUFFED NSTRL (MISCELLANEOUS) ×2 IMPLANT
SET BERKELEY SUCTION TUBING (SUCTIONS) ×2 IMPLANT
SYR CONTROL 10ML LL (SYRINGE) ×2 IMPLANT
TOWEL OR 17X24 6PK STRL BLUE (TOWEL DISPOSABLE) ×4 IMPLANT
VACURETTE 10 RIGID CVD (CANNULA) IMPLANT
VACURETTE 7MM CVD STRL WRAP (CANNULA) ×1 IMPLANT
VACURETTE 8 RIGID CVD (CANNULA) IMPLANT
VACURETTE 9 RIGID CVD (CANNULA) IMPLANT
WATER STERILE IRR 1000ML POUR (IV SOLUTION) ×2 IMPLANT

## 2012-03-17 NOTE — Op Note (Signed)
[name], Amanda Massey            ACCOUNT NO.:  [id number]  MEDICAL RECORD NO.:  [id number]  LOCATION:  [facility]                          FACILITY:  [facility]  PHYSICIAN:  Desmon Hitchner L. Lorissa Kishbaugh, M.D.DATE OF BIRTH:  [birth year]  DATE OF PROCEDURE:  03/17/2012 DATE OF DISCHARGE:                              OPERATIVE REPORT   PREOPERATIVE DIAGNOSIS:  Missed abortion.  POSTOPERATIVE DIAGNOSIS:  Missed abortion.  PROCEDURE:  D and E.  SURGEON:  Doria Fern L. Vincente Poli, M.D.  ANESTHESIA:  MAC with paracervical block.  EBL:  Minimal.  COMPLICATIONS:  None.  PROCEDURE:  The patient was taken to the operating room.  Anesthesia was administered.  She was placed in the lithotomy position.  She was prepped and draped in the usual sterile fashion.  In and out catheter was used to empty the bladder.  The cervix was grasped with a tenaculum. Paracervical block was performed in standard fashion.  The cervical internal os was gently dilated using Pratt dilators and a #7 suction cannula was inserted to the uterus.  The uterus was thoroughly curetted of all tissue.  The sharp curette was inserted and the uterus was finally curetted.  The uterine cavity was clean.  All instruments were removed from the vagina.  All sponge, lap, and instrument counts were correct x2.  The patient went to recovery room in stable condition.     Joquan Lotz L. Vincente Poli, M.D.     Florestine Avers  D:  03/17/2012  T:  03/17/2012  Job:  [id number]

## 2012-03-17 NOTE — Progress Notes (Signed)
History and physical on chart. No significant changes Will proceed with D and E Risks discussed with patient Consent signed.

## 2012-03-17 NOTE — Brief Op Note (Signed)
03/17/2012  8:38 AM  PATIENT:  Amanda Massey  30 y.o. female  PRE-OPERATIVE DIAGNOSIS:  Missed AB  POST-OPERATIVE DIAGNOSIS:  Missed AB  PROCEDURE:  Procedure(s) (LRB): DILATATION AND EVACUATION (N/A)  SURGEON:  Surgeon(s) and Role:    * Jeani Hawking, MD - Primary  PHYSICIAN ASSISTANT:   ASSISTANTS: none   ANESTHESIA:   IV sedation and paracervical block  EBL:     BLOOD ADMINISTERED:none  DRAINS: none   LOCAL MEDICATIONS USED:  LIDOCAINE   SPECIMEN:  Source of Specimen:  POC  DISPOSITION OF SPECIMEN:  PATHOLOGY  COUNTS:  YES  TOURNIQUET:  * No tourniquets in log *  DICTATION: .Other Dictation: Dictation Number [id number]  PLAN OF CARE: Discharge to home after PACU  PATIENT DISPOSITION:  PACU - hemodynamically stable.   Delay start of Pharmacological VTE agent (>24hrs) due to surgical blood loss or risk of bleeding: yes

## 2012-03-17 NOTE — H&P (Signed)
31 year old G 2 P 0 with an anembryonic pregnancy. Here for D and E. Patient has had abnormally rising quant HCGs and this week her quant started to fall. Ultrasound in the office consistent with empty gestational sac around 2.5 cm  Patient counseled about expectant management versus D and E. She called the office yesterday requesting a D and E as soon as possible. Scheduled for today.  Med History  PCOS  NKDA  Afebrile  Vital signs stable General alert and oriented Lung CTAB Car RRR Abdomen is soft and non tender Pelvic 7 week size  IMPRESSION: Missed AB  PLAN: D and E Risks discussed with patient ABO and RH

## 2012-03-17 NOTE — Discharge Instructions (Signed)

## 2012-03-17 NOTE — Transfer of Care (Signed)
Immediate Anesthesia Transfer of Care Note  Patient: Amanda Massey  Procedure(s) Performed: Procedure(s) (LRB): DILATATION AND EVACUATION (N/A)  Patient Location: PACU  Anesthesia Type: General  Level of Consciousness: awake, alert  and oriented  Airway & Oxygen Therapy: Patient Spontanous Breathing and Patient connected to nasal cannula oxygen  Post-op Assessment: Report given to PACU RN and Post -op Vital signs reviewed and stable  Post vital signs: Reviewed and stable  Complications: No apparent anesthesia complications

## 2012-03-17 NOTE — Anesthesia Postprocedure Evaluation (Signed)
  Anesthesia Post-op Note  Patient: Amanda Massey  Procedure(s) Performed: Procedure(s) (LRB): DILATATION AND EVACUATION (N/A)Patient is awake and responsive. Pain and nausea are reasonably well controlled. Vital signs are stable and clinically acceptable. Oxygen saturation is clinically acceptable. There are no apparent anesthetic complications at this time. Patient is ready for discharge.    

## 2012-03-17 NOTE — Anesthesia Preprocedure Evaluation (Signed)
Anesthesia Evaluation  Patient identified by MRN, date of birth, ID band Patient awake    Reviewed: Allergy & Precautions, H&P , Patient's Chart, lab work & pertinent test results, reviewed documented beta blocker date and time   Airway Mallampati: III TM Distance: >3 FB Neck ROM: full  Mouth opening: Limited Mouth Opening  Dental No notable dental hx.    Pulmonary asthma (more allergy related, chest clear today, rare inhaler use) ,  breath sounds clear to auscultation  Pulmonary exam normal       Cardiovascular hypertension (normal today), Rhythm:regular Rate:Normal     Neuro/Psych    GI/Hepatic   Endo/Other  Morbid obesity  Renal/GU      Musculoskeletal   Abdominal   Peds  Hematology   Anesthesia Other Findings   Reproductive/Obstetrics                           Anesthesia Physical Anesthesia Plan  ASA: III  Anesthesia Plan: General   Post-op Pain Management:    Induction: Intravenous  Airway Management Planned: LMA  Additional Equipment:   Intra-op Plan:   Post-operative Plan:   Informed Consent: I have reviewed the patients History and Physical, chart, labs and discussed the procedure including the risks, benefits and alternatives for the proposed anesthesia with the patient or authorized representative who has indicated his/her understanding and acceptance.   Dental Advisory Given  Plan Discussed with: CRNA and Surgeon  Anesthesia Plan Comments: (  Discussed  general anesthesia, including possible nausea, instrumentation of airway, sore throat,pulmonary aspiration, etc. I asked if the were any outstanding questions, or  concerns before we proceeded. )        Anesthesia Quick Evaluation

## 2012-03-19 NOTE — Anesthesia Postprocedure Evaluation (Signed)
  Anesthesia Post-op Note  Patient: Amanda Massey  Procedure(s) Performed: Procedure(s) (LRB): DILATATION AND EVACUATION (N/A)Patient is awake and responsive. Pain and nausea are reasonably well controlled. Vital signs are stable and clinically acceptable. Oxygen saturation is clinically acceptable. There are no apparent anesthetic complications at this time. Patient is ready for discharge.

## 2012-03-19 NOTE — Addendum Note (Signed)
Addendum  created 03/19/12 0717 by Jiles Garter, MD   Modules edited:Notes Section

## 2012-03-20 ENCOUNTER — Encounter (HOSPITAL_COMMUNITY): Payer: Self-pay | Admitting: Obstetrics and Gynecology

## 2012-03-20 ENCOUNTER — Inpatient Hospital Stay (HOSPITAL_COMMUNITY)
Admission: AD | Admit: 2012-03-20 | Discharge: 2012-03-20 | Disposition: A | Discharge status: Home / Self Care | Payer: 59 | Source: Ambulatory Visit | Attending: Obstetrics and Gynecology | Admitting: Obstetrics and Gynecology

## 2012-03-20 DIAGNOSIS — O8612 Endometritis following delivery: Secondary | ICD-10-CM | POA: Insufficient documentation

## 2012-03-20 DIAGNOSIS — N719 Inflammatory disease of uterus, unspecified: Secondary | ICD-10-CM

## 2012-03-20 DIAGNOSIS — R109 Unspecified abdominal pain: Secondary | ICD-10-CM | POA: Insufficient documentation

## 2012-03-20 LAB — CBC
MCHC: 33 g/dL (ref 30.0–36.0)
Platelets: 341 10*3/uL (ref 150–400)
RDW: 13.4 % (ref 11.5–15.5)
WBC: 13 10*3/uL — ABNORMAL HIGH (ref 4.0–10.5)

## 2012-03-20 LAB — URINALYSIS, ROUTINE W REFLEX MICROSCOPIC
Glucose, UA: NEGATIVE mg/dL
Ketones, ur: NEGATIVE mg/dL
Nitrite: NEGATIVE
Protein, ur: NEGATIVE mg/dL
Urobilinogen, UA: 0.2 mg/dL (ref 0.0–1.0)

## 2012-03-20 LAB — URINE MICROSCOPIC-ADD ON

## 2012-03-20 MED ORDER — CEFTRIAXONE SODIUM 1 G IJ SOLR
500.0000 mg | Freq: Once | INTRAMUSCULAR | Status: AC
  Administered 2012-03-20: 500 mg via INTRAMUSCULAR
  Filled 2012-03-20: qty 10

## 2012-03-20 MED ORDER — IBUPROFEN 600 MG PO TABS: 600.0000 mg | ORAL_TABLET | Freq: Three times a day (TID) | ORAL | Status: AC | PRN

## 2012-03-20 MED ORDER — AMOXICILLIN-POT CLAVULANATE 875-125 MG PO TABS: 1.0000 | ORAL_TABLET | Freq: Two times a day (BID) | ORAL | Status: AC

## 2012-03-20 MED ORDER — KETOROLAC TROMETHAMINE 60 MG/2ML IM SOLN
60.0000 mg | Freq: Once | INTRAMUSCULAR | Status: AC
  Administered 2012-03-20: 60 mg via INTRAMUSCULAR
  Filled 2012-03-20: qty 2

## 2012-03-20 NOTE — Discharge Instructions (Signed)
Endometritis  Endometritis is an irritation, soreness, and swelling (inflammation) of the lining of the uterus (endometrium).   CAUSES    Bacterial infections.   Sexually transmitted infections (STIs).   Having a miscarriage or childbirth, especially after a long labor or cesarean delivery.   Certain gynecological procedures (D&C, hysteroscopy, or contraceptive insertion).  SYMPTOMS    Fever.   Lower abdominal or pelvic pain.   Abnormal vaginal discharge or bleeding.   Abdominal bloating (distention) or swelling.   General discomfort or ill feeling.   Discomfort with bowel movements.  DIAGNOSIS   A physical and pelvic exam are performed. Other tests may include:   Cultures from the cervix.   Blood tests.   Examining a tissue sample of the uterine lining (endometrial biopsy).   Examining discharge under a microscope (wet prep).   Laparoscopy.  TREATMENT   Antibiotic medicines are usually given. Other treatments may include:   Fluids through the vein (IV fluids).   Rest.  HOME CARE INSTRUCTIONS    Take over-the-counter or prescription medicines for pain, discomfort, or fever as directed by your caregiver.   Resume your normal diet and activities as directed or as tolerated.   Do not douche or have sexual intercourse until your caregiver says it is okay.   Take your antibiotics as directed. Finish them even if you start to feel better.   Do not have sexual intercourse until your partner has been treated if your endometritis is caused by an STI.  SEEK IMMEDIATE MEDICAL CARE IF:    You have swelling or increasing pain in the abdomen.   You have a fever.   You have bad smelling vaginal discharge, or you have an increased amount of discharge.   You have abnormal vaginal bleeding.   Your medicine is not helping with the pain.   You experience any problems that may be related to the medicine you are taking.   You have nausea and vomiting, or you cannot keep foods down.   You have pain with bowel  movements.  MAKE SURE YOU:    Understand these instructions.   Will watch your condition.   Will get help right away if you are not doing well or get worse.  Document Released: 10/26/2001 Document Revised: 10/21/2011 Document Reviewed: 05/31/2011  ExitCare Patient Information 2012 [employer].

## 2012-03-20 NOTE — MAU Note (Signed)
Pt sttes 1000mg  tylenol at 1830-took 1/2 tab hydrocodone at 2000-states the pain med makes her sleep but doesn't take the bed away

## 2012-03-20 NOTE — MAU Note (Signed)
D & E on  03/17/2012. For 7wk miscarriage having increased abd pain, bloating and vag discharge today.  Pain unrelieved by pain med.  Reports constipation-took Milk of Mag with minimal results.

## 2012-03-20 NOTE — MAU Provider Note (Signed)
History     CSN: [id number]  Arrival date and time: 03/20/12 2113   First Provider Initiated Contact with Patient 03/20/12 2158      Chief Complaint  Patient presents with  . Abdominal Pain   HPI 31 y.o. G2P1011 3 days s/p D&E with worsening low abd pain. Severe cramping, wraps around to back, worst in front left. Able to sleep on hydrocodone, but pain is still there when awakes. Bleeding not heavy. Constipation during pregnancy - small BM on Saturday on Sunday.    Past Medical History  Diagnosis Date  . PCOS (polycystic ovarian syndrome)   . Anxiety   . Asthma   . Vertigo   . Prediabetes   . Obesity   . Hypertension     no rx meds  . Seasonal allergies   . Back pain     tx with OTC meds    Past Surgical History  Procedure Date  . Wisdom tooth extraction   . Multiple tooth extractions     for braces  . Svd     x 1  . Dilation and evacuation 03/17/2012    Procedure: DILATATION AND EVACUATION;  Surgeon: Jeani Hawking, MD;  Location: [facility];  Service: Gynecology;  Laterality: N/A;    Family History  Problem Relation Age of Onset  . Parkinsonism Father   . Cancer Neg Hx   . Alcohol abuse Neg Hx   . Depression Neg Hx   . Diabetes Neg Hx   . Hypertension Neg Hx   . Hyperlipidemia Neg Hx   . Heart attack Maternal Grandfather     History  Substance Use Topics  . Smoking status: Never Smoker   . Smokeless tobacco: Never Used  . Alcohol Use: No    Allergies: No Known Allergies  Prescriptions prior to admission  Medication Sig Dispense Refill  . albuterol (PROVENTIL HFA;VENTOLIN HFA) 108 (90 BASE) MCG/ACT inhaler Inhale 1-2 puffs into the lungs every 6 (six) hours as needed for wheezing.  1 Inhaler  0  . b complex vitamins tablet Take 2 tablets by mouth daily.       . busPIRone (BUSPAR) 15 MG tablet Take 15 mg by mouth 3 (three) times daily.        Dewayne Shorter Seed OIL Take 2 capsules by mouth daily. Takes for BP      . cetirizine (ZYRTEC ALLERGY) 10 MG  tablet Take 1 tablet (10 mg total) by mouth daily.  30 tablet  6  . Cholecalciferol (VITAMIN D3) 5000 UNITS TABS Take 1 tablet by mouth every morning.      . citalopram (CELEXA) 20 MG tablet Take 20 mg by mouth 2 (two) times daily. She is not weaning this med.      . clonazePAM (KLONOPIN) 1 MG tablet Take 1.25 mg by mouth 3 (three) times daily as needed.       Marland Kitchen exenatide (BYETTA) 10 MCG/0.04ML SOLN Inject 20 mcg into the skin daily.       . fluticasone (FLONASE) 50 MCG/ACT nasal spray 2 sprays into each nostril daily  16 g  2  . Fluticasone-Salmeterol (ADVAIR DISKUS) 100-50 MCG/DOSE AEPB Inhale 1 puff into the lungs 2 (two) times daily.  60 each  3  . HYDROcodone-ibuprofen (VICOPROFEN) 7.5-200 MG per tablet Take 1 tablet by mouth every 8 (eight) hours as needed for pain.  30 tablet  0  . meclizine (ANTIVERT) 25 MG tablet Take 1 tablet (25 mg total) by mouth 3 (  three) times daily as needed.  30 tablet  1  . Prenatal Vit-Fe Fumarate-FA (PRENATAL PO) Take 1 tablet by mouth daily.        Review of Systems  Constitutional: Negative.   Respiratory: Negative.   Cardiovascular: Negative.   Gastrointestinal: Positive for abdominal pain and constipation. Negative for nausea, vomiting and diarrhea.  Genitourinary: Negative for dysuria, urgency, frequency, hematuria and flank pain.       Positive for vaginal bleeding  Musculoskeletal: Negative.   Neurological: Negative.   Psychiatric/Behavioral: Negative.    Physical Exam   Last menstrual period 01/06/2011, unknown if currently breastfeeding.  Physical Exam  Nursing note and vitals reviewed. Constitutional: She is oriented to person, place, and time. She appears well-developed and well-nourished. No distress.  Cardiovascular: Normal rate.   Respiratory: Effort normal. No respiratory distress.  GI: Soft. Bowel sounds are normal. She exhibits no distension and no mass. There is no tenderness. There is no rebound and no guarding.  Genitourinary:  Uterus is tender. Cervix exhibits motion tenderness. Right adnexum displays tenderness. Left adnexum displays tenderness. There is bleeding (moderate) around the vagina.  Musculoskeletal: Normal range of motion.  Neurological: She is alert and oriented to person, place, and time.  Skin: Skin is warm and dry.  Psychiatric: She has a normal mood and affect.    MAU Course  Procedures  Results for orders placed during the hospital encounter of 03/20/12 (from the past 24 hour(s))  URINALYSIS, ROUTINE W REFLEX MICROSCOPIC     Status: Abnormal   Collection Time   03/20/12  9:25 PM      Component Value Range   Color, Urine RED (*) YELLOW    APPearance CLOUDY (*) CLEAR    Specific Gravity, Urine 1.015  1.005 - 1.030    pH 6.5  5.0 - 8.0    Glucose, UA NEGATIVE  NEGATIVE (mg/dL)   Hgb urine dipstick LARGE (*) NEGATIVE    Bilirubin Urine NEGATIVE  NEGATIVE    Ketones, ur NEGATIVE  NEGATIVE (mg/dL)   Protein, ur NEGATIVE  NEGATIVE (mg/dL)   Urobilinogen, UA 0.2  0.0 - 1.0 (mg/dL)   Nitrite NEGATIVE  NEGATIVE    Leukocytes, UA MODERATE (*) NEGATIVE   URINE MICROSCOPIC-ADD ON     Status: Abnormal   Collection Time   03/20/12  9:25 PM      Component Value Range   Squamous Epithelial / LPF FEW (*) RARE    WBC, UA 3-6  <3 (WBC/hpf)   RBC / HPF 21-50  <3 (RBC/hpf)   Bacteria, UA RARE  RARE   CBC     Status: Abnormal   Collection Time   03/20/12  9:52 PM      Component Value Range   WBC 13.0 (*) 4.0 - 10.5 (K/uL)   RBC 4.02  3.87 - 5.11 (MIL/uL)   Hemoglobin 12.0  12.0 - 15.0 (g/dL)   HCT 16.1  09.6 - 04.5 (%)   MCV 90.5  78.0 - 100.0 (fL)   MCH 29.9  26.0 - 34.0 (pg)   MCHC 33.0  30.0 - 36.0 (g/dL)   RDW 40.9  81.1 - 91.4 (%)   Platelets 341  150 - 400 (K/uL)     Assessment and Plan  30 y.o. G2P1011 3 days s/p D&E with endometritis Toradol 60 mg IM and Rocephin 500 mg IM given in MAU D/C home with Augmentin 875/125mg  po bid x 7 days F/U in office this week  FRAZIER,NATALIE 03/20/2012,  9:59  PM   Feels better after Toradol  Discharge home M[facility] CNM<

## 2012-04-12 ENCOUNTER — Other Ambulatory Visit: Payer: Self-pay | Admitting: Obstetrics and Gynecology

## 2012-04-20 ENCOUNTER — Other Ambulatory Visit: Payer: Self-pay | Admitting: Family Medicine

## 2012-04-21 ENCOUNTER — Telehealth: Payer: Self-pay | Admitting: Family Medicine

## 2012-04-21 DIAGNOSIS — J45909 Unspecified asthma, uncomplicated: Secondary | ICD-10-CM

## 2012-04-21 MED ORDER — FLUTICASONE-SALMETEROL 100-50 MCG/DOSE IN AEPB: 1.0000 | INHALATION_SPRAY | Freq: Two times a day (BID) | RESPIRATORY_TRACT | Status: DC

## 2012-04-21 NOTE — Telephone Encounter (Signed)
Rx sent to pharmacy and patient notified.   

## 2012-04-21 NOTE — Telephone Encounter (Signed)
Pt will be out of her inhaler tonight and this is the one that she uses for prevention.  Wants to know if this can be called in today?

## 2012-05-17 ENCOUNTER — Other Ambulatory Visit: Payer: Self-pay | Admitting: Family Medicine

## 2012-05-31 ENCOUNTER — Ambulatory Visit: Payer: 59 | Admitting: *Deleted

## 2012-06-21 ENCOUNTER — Ambulatory Visit: Payer: 59 | Admitting: *Deleted

## 2012-06-23 ENCOUNTER — Telehealth: Payer: Self-pay | Admitting: Family Medicine

## 2012-06-23 NOTE — Telephone Encounter (Signed)
Patient is calling to check on the status of a refill on Advair.  She said that [facility] sent it yesterday, but I do not see it.  She is completely out.

## 2012-06-23 NOTE — Telephone Encounter (Signed)
Please confirm with pharmacy- patient was sent 6 refills in July

## 2012-06-26 NOTE — Telephone Encounter (Signed)
Pt stated that it has been resolved .Amanda Massey

## 2012-06-29 ENCOUNTER — Encounter: Payer: Self-pay | Admitting: *Deleted

## 2012-06-29 ENCOUNTER — Encounter: Payer: 59 | Attending: Family Medicine | Admitting: *Deleted

## 2012-06-29 VITALS — Ht 66.0 in | Wt 312.5 lb

## 2012-06-29 DIAGNOSIS — R7309 Other abnormal glucose: Secondary | ICD-10-CM | POA: Insufficient documentation

## 2012-06-29 DIAGNOSIS — Z713 Dietary counseling and surveillance: Secondary | ICD-10-CM | POA: Insufficient documentation

## 2012-06-29 NOTE — Progress Notes (Signed)
Medical Nutrition Therapy:  Appt start time: 1600  end time:  1700.  Primary Concerns Today:  Prediabetes; Follow up. Pt is here today for f/u.  She suffered a miscarriage soon after her last visit. Has gained 17 lbs from emotional eating, but has been working on getting back on track for last 2 weeks.  Reports logging all foods in [employer]! Discussed restarting initial goals and will f/u in 4 weeks.  Last A1c: 5/7% at Dr. Adalberto Ill 04/12/12 (per pt)  TANITA  BODY COMP RESULTS  12/23/11 02/28/12 06/29/12  %Fat 50.5% 50.9% 53.4%  FM (lbs) 151.0 150.0 167.0  FFM (lbs) 148.5 145.0 145.5  TBW (lbs) 108.5 106.0 106.5   MEDICATIONS: No changes reported.   DIETARY INTAKE:  Usual eating pattern includes 3 meals and 0-2 snacks per day.  Usual physical activity: None reported.   Estimated energy needs:  TBD  Progress Towards Goal(s):  In progress.   Nutritional Diagnosis:  [place]-2.2 Altered nutrition-related laboratory related to glucose metabolism as evidenced by recent A1c of 5.9% and referral for DM education.    Intervention/Goals:    Restart previous goals  Monitoring/Evaluation:  Dietary intake, exercise, A1c, and body weight in 4-6 weeks.

## 2012-06-29 NOTE — Patient Instructions (Addendum)
RESTART INITIAL GOALS:   Follow Prediabetes Meal Plan (see yellow card). Focus on carb portions for the first month.  Add protein rich foods to all meals and snacks.  Choose more whole grains, lean protein, low-fat dairy, and fruits/non-starchy vegetables.   Aim for >30 min of physical activity daily.  Limit sugar-sweetened beverages, concentrated sweets, and meals away from home.

## 2012-08-14 ENCOUNTER — Ambulatory Visit: Payer: 59 | Admitting: *Deleted

## 2012-08-24 ENCOUNTER — Encounter: Payer: Self-pay | Admitting: Family Medicine

## 2012-08-25 ENCOUNTER — Inpatient Hospital Stay: Admission: RE | Admit: 2012-08-25 | Payer: 59 | Source: Ambulatory Visit | Admitting: Obstetrics and Gynecology

## 2012-08-31 ENCOUNTER — Ambulatory Visit: Payer: 59 | Admitting: *Deleted

## 2012-09-11 ENCOUNTER — Ambulatory Visit: Payer: 59 | Admitting: *Deleted

## 2012-09-28 ENCOUNTER — Encounter: Payer: Self-pay | Admitting: *Deleted

## 2012-09-28 ENCOUNTER — Encounter: Payer: 59 | Attending: Family Medicine | Admitting: *Deleted

## 2012-09-28 VITALS — Ht 66.0 in | Wt 317.5 lb

## 2012-09-28 DIAGNOSIS — R7303 Prediabetes: Secondary | ICD-10-CM

## 2012-09-28 DIAGNOSIS — R7309 Other abnormal glucose: Secondary | ICD-10-CM | POA: Insufficient documentation

## 2012-09-28 DIAGNOSIS — Z713 Dietary counseling and surveillance: Secondary | ICD-10-CM | POA: Insufficient documentation

## 2012-09-28 NOTE — Progress Notes (Addendum)
Medical Nutrition Therapy:  Appt start time: 1530  end time:  1600.  Primary Concerns Today:  Prediabetes; Follow up. Amanda Massey is here for f/u with a weight gain of 5 lbs. Reports having a hard time so far this month d/t the fact that her baby was due Nov 25th. She and her husband are trying to conceive again and she is planning to lead a bible study relating to loss of life. Still tracking food intake and meeting ~1500-1600 cal/day until 2 mos ago; now averaging ~ 2500 cal/day at least 2-3 days/week. Overall, averages ~2000 cal/day. Now trying to get back on track and has stopped buying "bad" snacks. Has coffee in the morning, water during the day, and diet soda at night (sometimes sweet tea if out to dinner). Has f/u scheduled with Gilman [facility]ner at [facility] next month.   Last A1c: 5.7% at Dr. Adalberto Ill 04/12/12 (per pt) - no new A1c reported  TANITA  BODY COMP RESULTS  12/23/11 02/28/12 06/29/12 09/28/12  %Fat 50.5% 50.9% 53.4% 54.6%  FM (lbs) 151.0 150.0 167.0 173.5  FFM (lbs) 148.5 145.0 145.5 144.0  TBW (lbs) 108.5 106.0 106.5 105.5   MEDICATIONS: No changes reported.   DIETARY INTAKE:  Usual eating pattern includes 3 meals and 0-2 snacks per day.  24 hour recall B: Oatmeal, coffee S: None L: Cereal bar ([employer] mixed berry) S: None D: Spaghetti (3 oz pasta, 3 oz meat sauce) w/ 6 tbsp parmasean cheese, 2 pcs toast w/ margarine (660 cal)  Usual physical activity: None reported.  States she can't fit it in because she is busier at work now. Trying to find time to incorporate.   Estimated energy needs:  1500 calories 180-200 g CHO 90-95 g protein 45-50 g fat  Progress Towards Goal(s):  In progress.   Nutritional Diagnosis:  [place]-2.2 Altered nutrition-related laboratory related to glucose metabolism as evidenced by recent A1c of 5.9% and referral for DM education.    Intervention/Goals:    Keep carb intake to 45-60 g per meal.   Work towards 45 g per meal and 15 g per snack.   Have  protein with ALL carbs.   Aim for 30 min of exercise daily. Start with 2-3 days/week and increase as able.   Monitoring/Evaluation:  Dietary intake, exercise, A1c, and body weight in 8 weeks.

## 2012-09-28 NOTE — Patient Instructions (Addendum)
Goals:    Keep carb intake to 45-60 g per meal.   Work towards 45 g per meal and 15 g per snack.   Have protein with ALL carbs.   Aim for 30 min of exercise daily. Start with 2-3 days/week and increase as able.

## 2012-12-07 ENCOUNTER — Encounter: Payer: Self-pay | Admitting: Family Medicine

## 2012-12-07 ENCOUNTER — Ambulatory Visit (INDEPENDENT_AMBULATORY_CARE_PROVIDER_SITE_OTHER): Payer: 59 | Admitting: Family Medicine

## 2012-12-07 VITALS — BP 136/85 | HR 96 | Ht 66.0 in | Wt 325.0 lb

## 2012-12-07 DIAGNOSIS — R7309 Other abnormal glucose: Secondary | ICD-10-CM

## 2012-12-07 DIAGNOSIS — R42 Dizziness and giddiness: Secondary | ICD-10-CM

## 2012-12-07 DIAGNOSIS — J4520 Mild intermittent asthma, uncomplicated: Secondary | ICD-10-CM

## 2012-12-07 DIAGNOSIS — R7303 Prediabetes: Secondary | ICD-10-CM | POA: Insufficient documentation

## 2012-12-07 DIAGNOSIS — Z0000 Encounter for general adult medical examination without abnormal findings: Secondary | ICD-10-CM

## 2012-12-07 DIAGNOSIS — J45909 Unspecified asthma, uncomplicated: Secondary | ICD-10-CM

## 2012-12-07 LAB — CBC WITH DIFFERENTIAL/PLATELET
Basophils Relative: 0 % (ref 0–1)
Eosinophils Absolute: 0.3 10*3/uL (ref 0.0–0.7)
Eosinophils Relative: 3 % (ref 0–5)
Hemoglobin: 12.9 g/dL (ref 12.0–15.0)
Lymphs Abs: 3.2 10*3/uL (ref 0.7–4.0)
MCH: 28.7 pg (ref 26.0–34.0)
MCHC: 33.2 g/dL (ref 30.0–36.0)
MCV: 86.4 fL (ref 78.0–100.0)
Monocytes Relative: 7 % (ref 3–12)
Platelets: 390 10*3/uL (ref 150–400)
RBC: 4.49 MIL/uL (ref 3.87–5.11)

## 2012-12-07 LAB — COMPREHENSIVE METABOLIC PANEL
ALT: 16 U/L (ref 0–35)
AST: 16 U/L (ref 0–37)
Albumin: 4.4 g/dL (ref 3.5–5.2)
Alkaline Phosphatase: 77 U/L (ref 39–117)
BUN: 11 mg/dL (ref 6–23)
Calcium: 9.3 mg/dL (ref 8.4–10.5)
Chloride: 101 mEq/L (ref 96–112)
Potassium: 4.2 mEq/L (ref 3.5–5.3)

## 2012-12-07 LAB — LIPID PANEL
Cholesterol: 143 mg/dL (ref 0–200)
LDL Cholesterol: 78 mg/dL (ref 0–99)
Total CHOL/HDL Ratio: 2.9 Ratio
VLDL: 16 mg/dL (ref 0–40)

## 2012-12-07 MED ORDER — FLUTICASONE-SALMETEROL 100-50 MCG/DOSE IN AEPB: 1.0000 | INHALATION_SPRAY | Freq: Two times a day (BID) | RESPIRATORY_TRACT | Status: DC

## 2012-12-07 NOTE — Assessment & Plan Note (Signed)
Has been doing well, on ly needing to use meclizine a few times per year

## 2012-12-07 NOTE — Assessment & Plan Note (Signed)
Well controlled.  Will  Refill advair for 1 year

## 2012-12-07 NOTE — Progress Notes (Signed)
  Subjective:    Patient ID: Valora Piccolo, female    DOB: Aug 08, 1981, 32 y.o.   MRN: [id number]  HPI Here for annual physical  Doing well overall.  No concerns  PCOS:  Managed by OBGYN and Dietician  Asthma: doing well with advair.  Rarely uses albuterol.  Anxiety and depression: managed by psychiatrist.  Is stable.  Review of SystemsNeg     Objective:   Physical Exam GEN: Alert & Oriented, No acute distress HEENT: [place]/AT. EOMI, PERRLA, no conjunctival injection or scleral icterus.  Bilateral tympanic membranes intact without erythema or effusion.  .  Nares without edema or rhinorrhea.  Oropharynx is without erythema or exudates.  No anterior or posterior cervical lymphadenopathy. CV:  Regular Rate & Rhythm, no murmur Respiratory:  Normal work of breathing, CTAB Abd:  + BS, soft, no tenderness to palpation Ext: no pre-tibial edema        Assessment & Plan:  Screening UTD. paps by gyn

## 2012-12-07 NOTE — Patient Instructions (Addendum)
Have a great 2014!   Follow-up yearly or sooner if needed!

## 2012-12-07 NOTE — Assessment & Plan Note (Signed)
Will continue byetta, metformin, per gynecology and nutrition for PCOS.  Will check lipids, a1c today

## 2012-12-08 ENCOUNTER — Encounter: Payer: Self-pay | Admitting: Family Medicine

## 2013-02-22 ENCOUNTER — Other Ambulatory Visit: Payer: Self-pay | Admitting: Family Medicine

## 2013-03-13 ENCOUNTER — Ambulatory Visit (INDEPENDENT_AMBULATORY_CARE_PROVIDER_SITE_OTHER): Payer: Self-pay | Admitting: Family Medicine

## 2013-03-13 VITALS — BP 124/90 | Wt 332.0 lb

## 2013-03-13 DIAGNOSIS — R7309 Other abnormal glucose: Secondary | ICD-10-CM

## 2013-03-13 DIAGNOSIS — R7303 Prediabetes: Secondary | ICD-10-CM

## 2013-03-14 NOTE — Progress Notes (Signed)
Patient presents today for pharmacy consult as part of the employer-sponsored Link to Wellness program. Patient is pre-diabetic with current regimen including Metformin ER and Byetta. These medications double as therapy for PCOS and pre-diabetes. Last follow-up with PCP was in early January 2014. No changes at this time. Patient is generally followed by Gilman Buttner, RN and will return to her care after this visit.   Diabetes Assessment: MD managing Diabetes [facility]; checks feet daily; Type of Diabetes: Pre-Diabetes; Year of diagnosis 2008; does not take an aspirin a day; takes medications as prescribed; Diabetes Education 1:1 with Huntley Dec [facility] Attended full classes in 2008. Met with Huntley Dec to address DM management and weight management; A1c - 5.5 via MD office. Other Diabetes History: Patient presents today on current pre-diabetes and PCOS regimen including Metformin XR 500 mg daily and Byetta 20 mcg daily. Patient takes Byetta 20 mcg as two-10 mcg injections once daily before bed, as she was having difficulty remembering BID dosing. She does report some GI intolerance including nightly nausea which sometimes inhibits her ability to fall asleep. I have recommended she take Byetta before dinner to reduce GI upset. Patient does not use a glucometer and does not test at this time. Given her pre-diabetic diagnosis it is likely not necessary to test at this time. In the past patient has had gestational diabetes and tested regularly during her pregnancy. Patient denies s/sx of neuropathy or foot infection, and is up-to-date on eye exam.   Lifestyle Factors: Nutrition - Patient was seeing Huntley Dec H[place]el[place] for Nutrition consult, every 4-6 weeks for ~1 year, but has now completed these courses and sees Gilman Buttner, RN every 3 months. She continues counting calories with lose it app. Still following Sara's recommendation for 1500 cal per day, but admits that she often exceeds  her daily recommendation. Current average per App is ~2000 per day. She and her family are making efforts to eat healthier including gluten-free and organic. She has a daughter with ADHD tendency and MD recommended dietary changes to help with symptoms, these have included eliminating caffeine and most sweets and pt reports this has been good for the entire family. Patient also continues attempting to limit carbs. At this visit we reviewed differences between starchy and non-starchy carbs.   Exercise - No routine exercise at this time. Was riding recumbent bike regularly until miscarriage, and after that time, she d/c exercise due to stress and depressed state. She plans to resume using recumbent bike on lunch break or after work in [employer] for house cleaning by her husband. She has a very supportive husband and family and wishes to get back on track with her health.   Assessment: Patient is a pre-diabetic under good control with at-goal A1c of 5.5. She maintains good medication compliance and is working to improve both diet and exercise. I am hopeful she will remain on track over the coming months. She will follow-up with Harriett Sine, RN in ~3 months..  Plan: 1) It was very nice meeting you today! 2) Continue counting calories and limiting carb portions, attempt to limit calories to 1500 daily 3) Resume exercise on stationary bike. Attempt to exercise 2-3 days per week to start.  4) Try moving Byetta dose to pre-dinner (ideal time to take is 1 hour prior to a meal) 5) Return for follow-up with Harriett Sine in July

## 2013-04-12 NOTE — Progress Notes (Signed)
Patient ID: AYLAH YEARY, female   DOB: 21-Apr-1981, 32 y.o.   MRN: [id number] ATTENDING PHYSICIAN NOTE: I have reviewed the chart and agree with the plan as detailed above. Denny Levy MD Pager [phone]

## 2013-04-25 ENCOUNTER — Encounter: Payer: Self-pay | Admitting: Family Medicine

## 2013-04-25 ENCOUNTER — Ambulatory Visit (INDEPENDENT_AMBULATORY_CARE_PROVIDER_SITE_OTHER): Payer: 59 | Admitting: Family Medicine

## 2013-04-25 ENCOUNTER — Ambulatory Visit (HOSPITAL_COMMUNITY)
Admission: RE | Admit: 2013-04-25 | Discharge: 2013-04-25 | Disposition: A | Discharge status: Home / Self Care | Payer: 59 | Source: Ambulatory Visit | Attending: Family Medicine | Admitting: Family Medicine

## 2013-04-25 VITALS — BP 120/83 | HR 97 | Temp 98.1°F | Ht 66.0 in | Wt 343.0 lb

## 2013-04-25 DIAGNOSIS — R079 Chest pain, unspecified: Secondary | ICD-10-CM

## 2013-04-25 DIAGNOSIS — R5383 Other fatigue: Secondary | ICD-10-CM | POA: Insufficient documentation

## 2013-04-25 DIAGNOSIS — R9431 Abnormal electrocardiogram [ECG] [EKG]: Secondary | ICD-10-CM | POA: Insufficient documentation

## 2013-04-25 DIAGNOSIS — N912 Amenorrhea, unspecified: Secondary | ICD-10-CM

## 2013-04-25 DIAGNOSIS — R7309 Other abnormal glucose: Secondary | ICD-10-CM

## 2013-04-25 DIAGNOSIS — R5381 Other malaise: Secondary | ICD-10-CM

## 2013-04-25 DIAGNOSIS — R6889 Other general symptoms and signs: Secondary | ICD-10-CM

## 2013-04-25 DIAGNOSIS — R7303 Prediabetes: Secondary | ICD-10-CM

## 2013-04-25 LAB — BASIC METABOLIC PANEL
BUN: 13 mg/dL (ref 6–23)
CO2: 31 mEq/L (ref 19–32)
Calcium: 9.8 mg/dL (ref 8.4–10.5)
Creat: 0.73 mg/dL (ref 0.50–1.10)
Glucose, Bld: 93 mg/dL (ref 70–99)

## 2013-04-25 LAB — CBC
HCT: 38.5 % (ref 36.0–46.0)
Hemoglobin: 12.4 g/dL (ref 12.0–15.0)
MCH: 27.8 pg (ref 26.0–34.0)
MCHC: 32.2 g/dL (ref 30.0–36.0)
MCV: 86.3 fL (ref 78.0–100.0)
RBC: 4.46 MIL/uL (ref 3.87–5.11)

## 2013-04-25 LAB — TSH: TSH: 3.01 u[IU]/mL (ref 0.350–4.500)

## 2013-04-25 NOTE — Progress Notes (Signed)
Amanda Massey is a 32 y.o. female who presents to [facility] today for SD appt for decreased energy, weakness, HA, and "can't catch breath", and chest tightness  DIFFICULT TO APPRECIATE ONE SINGLE COMPLAINT SEPARATELY DUE TO SEEMINGLY NUMEROUS INTERTWINING COMPLAINTS  Fatigue started greater than 1 year ago. Sleeps most of at least one weekend day. 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 Feels exhausted at the end of the day. Extreme worsening over the past week. No h/o mono. Exercise regimen is inconsistent and pt is 40+ lbs heavier than 1 yr ago. HA associated w/ computer use. 86mo w/ different prescription glasses. Improves w/ 400mg  Advil. Taking Vit D for 4-5 years. Taking clonazepam TID, Celexa 30mg  BID, and Buspar 15mg  TID for anxiety and depression. Has already cut back on clonazepam. Prescribed by Psych doctor Changed jobs 6 months ago. Ambulates more now.  Going through a home move as well.   Diet: Oatmeal and coffee for breakfast.  Campbells soup w/ crackers w/ breakfast Drive thru vs home cooking 50/50.   LMP 03/02/13. Previously nml periods every 28 days TSH 1/23 nml  The following portions of the patient's history were reviewed and updated as appropriate: allergies, current medications, past medical history, family and social history, and problem list.  Patient is a nonsmoker.  Past Medical History  Diagnosis Date  . PCOS (polycystic ovarian syndrome)   . Anxiety   . Asthma   . Vertigo   . Prediabetes   . Obesity   . Hypertension     no rx meds  . Seasonal allergies   . Back pain     tx with OTC meds    ROS as above otherwise neg.    Medications reviewed. Current Outpatient Prescriptions  Medication Sig Dispense Refill  . albuterol (PROVENTIL HFA;VENTOLIN HFA) 108 (90 BASE) MCG/ACT inhaler Inhale 1-2 puffs into the lungs every 6 (six) hours as needed for wheezing.  1 Inhaler  0  . b complex vitamins tablet Take 2 tablets by mouth daily.       . busPIRone (BUSPAR) 15 MG tablet  Take 15 mg by mouth 3 (three) times daily.        Dewayne Shorter Seed OIL Take 2 capsules by mouth daily. Takes for BP      . cetirizine (ZYRTEC ALLERGY) 10 MG tablet Take 1 tablet (10 mg total) by mouth daily.  30 tablet  6  . Cholecalciferol (VITAMIN D3) 5000 UNITS TABS Take 1 tablet by mouth every morning.      . citalopram (CELEXA) 20 MG tablet Take 20 mg by mouth 2 (two) times daily. She is not weaning this med.      . clonazePAM (KLONOPIN) 1 MG tablet Take 1.25 mg by mouth 3 (three) times daily as needed.       Marland Kitchen exenatide (BYETTA) 10 MCG/0.04ML SOLN Inject 20 mcg into the skin daily.       . fluticasone (FLONASE) 50 MCG/ACT nasal spray USE 2 SPRAYS IN EACH NOSTRIL DAILY  16 g  5  . Fluticasone-Salmeterol (ADVAIR DISKUS) 100-50 MCG/DOSE AEPB Inhale 1 puff into the lungs 2 (two) times daily.  60 each  11  . ketotifen (ZADITOR) 0.025 % ophthalmic solution 1 drop 2 (two) times daily.      . meclizine (ANTIVERT) 25 MG tablet Take 1 tablet (25 mg total) by mouth 3 (three) times daily as needed.  30 tablet  1  . metFORMIN (GLUCOPHAGE) 500 MG tablet Take 500 mg by mouth 2 (two) times  daily with a meal. Per Dr. Vincente Poli- OBGYN      . Prenatal Vit-Fe Fumarate-FA (PRENATAL PO) Take 1 tablet by mouth daily.      . VENTOLIN HFA 108 (90 BASE) MCG/ACT inhaler INHALE 2 PUFFS INTO THE LUNGS EVERY 6 (SIX) HOURS AS NEEDED.  1 each  11   No current facility-administered medications for this visit.    Exam: BP 120/83  Pulse 97  Temp(Src) 98.1 F (36.7 C) (Oral)  Ht 5\' 6"  (1.676 m)  Wt 343 lb (155.584 kg)  BMI 55.39 kg/m2  SpO2 96%  LMP 03/02/2013 Gen: Well NAD, Obese HEENT: EOMI,  MMM Lungs: CTABL Nl WOB Heart: RRR no MRG Abd: NABS, NT, ND Exts: Non edematous BL  LE, warm and well perfused.   Results for orders placed in visit on 04/25/13 (from the past 72 hour(s))  POCT URINE PREGNANCY     Status: Normal   Collection Time    04/25/13  2:36 PM      Result Value Range   Preg Test, Ur Negative

## 2013-04-25 NOTE — Patient Instructions (Addendum)
Thank you for coming in today There is no one thing wrong today that is causing all of yoru symptoms. I think your symptoms are caused by many things, meaning they are multifactorial. To name a few: dietary causes, medication induced headaches, normal circadian rhythm, obesity hypoventilation, Medication induced fatigue with clonazepam, anxiety/depression, deconditioning, potentially hypothyroidism, PCOS, etc... As discussed you would do well to meet with your nutritionist to improve your diet Try to get back into an exercise regimen Try to cut back on your klonopin Try to limit your caffeine intake We will check several labs today to check for any abnormalities

## 2013-04-26 ENCOUNTER — Telehealth: Payer: Self-pay | Admitting: *Deleted

## 2013-04-26 ENCOUNTER — Other Ambulatory Visit (INDEPENDENT_AMBULATORY_CARE_PROVIDER_SITE_OTHER): Payer: 59

## 2013-04-26 DIAGNOSIS — R6889 Other general symptoms and signs: Secondary | ICD-10-CM

## 2013-04-26 NOTE — Telephone Encounter (Signed)
LMOVM for pt to return call .Fleeger, Jessica Dawn  

## 2013-04-26 NOTE — Assessment & Plan Note (Signed)
Spent >70min discussing numerous complaints w/ pt Likely multifactorial to include psychosomatic, hypothyroid, obesity, poor nutrition, depression/anxiety, obesity hypoventilation (difficulty breathing specifically), deconditioning, anemia, Benzo induced symptoms Upreg negative TSH, BMET, Mono, CBC, A1c

## 2013-04-26 NOTE — Assessment & Plan Note (Addendum)
TSH, CBC, See somatic complaint workup

## 2013-04-26 NOTE — Telephone Encounter (Signed)
Message copied by Osborne Oman on Thu Apr 26, 2013 10:01 AM ------      Message from: [facility], DAVID J      Created: Thu Apr 26, 2013  9:18 AM       Please call pt and have come in at convenience for Mono test ------

## 2013-04-27 ENCOUNTER — Other Ambulatory Visit: Payer: 59

## 2013-06-06 ENCOUNTER — Other Ambulatory Visit: Payer: Self-pay | Admitting: Obstetrics and Gynecology

## 2013-06-12 ENCOUNTER — Ambulatory Visit (HOSPITAL_COMMUNITY)
Admission: RE | Admit: 2013-06-12 | Discharge: 2013-06-12 | Disposition: A | Discharge status: Home / Self Care | Payer: 59 | Source: Ambulatory Visit | Attending: Family Medicine | Admitting: Family Medicine

## 2013-06-12 ENCOUNTER — Ambulatory Visit (INDEPENDENT_AMBULATORY_CARE_PROVIDER_SITE_OTHER): Payer: 59 | Admitting: Family Medicine

## 2013-06-12 ENCOUNTER — Encounter: Payer: Self-pay | Admitting: Family Medicine

## 2013-06-12 VITALS — BP 139/87 | HR 94 | Temp 98.0°F | Wt 345.0 lb

## 2013-06-12 DIAGNOSIS — R0602 Shortness of breath: Secondary | ICD-10-CM | POA: Insufficient documentation

## 2013-06-12 DIAGNOSIS — R0789 Other chest pain: Secondary | ICD-10-CM

## 2013-06-12 DIAGNOSIS — R609 Edema, unspecified: Secondary | ICD-10-CM

## 2013-06-12 DIAGNOSIS — R079 Chest pain, unspecified: Secondary | ICD-10-CM | POA: Insufficient documentation

## 2013-06-12 DIAGNOSIS — R030 Elevated blood-pressure reading, without diagnosis of hypertension: Secondary | ICD-10-CM

## 2013-06-12 DIAGNOSIS — R600 Localized edema: Secondary | ICD-10-CM | POA: Insufficient documentation

## 2013-06-12 MED ORDER — PROPRANOLOL HCL 10 MG PO TABS: 10.0000 mg | ORAL_TABLET | Freq: Three times a day (TID) | ORAL | Status: DC

## 2013-06-12 MED ORDER — OMEPRAZOLE 40 MG PO CPDR: 40.0000 mg | DELAYED_RELEASE_CAPSULE | Freq: Every day | ORAL | Status: DC

## 2013-06-12 NOTE — Assessment & Plan Note (Signed)
Trace today, but reports worsening Likely venous stasis secondary to venous insufficiency vs nml physiologic changes w/ obesity summer heat.  Wt loss recommended No need for compression stocking at this point.

## 2013-06-12 NOTE — Assessment & Plan Note (Signed)
Anxiety related vs obesity hypoventilation as pt w/ 50lb wt gain in past year vs cardiac in nature Pt to cont w/ wt loss and dietician counseling through [facility] Increase aerobic exercise. May receive some benefit from propranolol as sometimes associated w/ palpitation.

## 2013-06-12 NOTE — Assessment & Plan Note (Addendum)
Atypical w/ palpitations and only last a few seconds at a time EKG today w/ NSR no signs of ischemia Likely anxiety component vs PSVT vs GERD 7 day halter monitor ordered Propranolol ordered (to start after halter monitor)

## 2013-06-12 NOTE — Progress Notes (Signed)
Amanda Massey is a 32 y.o. female who presents to [facility] today for SD appt for retaining fluid and CP, SOB.  First episode 5-6 days ago when walking from parking deck to hospital. Became SOB and dyspnea and body tightness. Stopped and sat down and symptoms improved after 3-4 min. Occurred one other time after ambulating 30-40 feet.   CP started on Friday. Occurs at random times. At rest and w/ exertion. In middle of chest. Described as achy and sudden in onset. Only lasts for about 3 sec at a time. Often comes on in clusters over a 5 min. Period or so. Associated w/ palpitations (which pt endorses having with panic attacks).   Endorses home BP monitoring that fluctuates from the 108 to 150s on a regular basis.   The following portions of the patient's history were reviewed and updated as appropriate: allergies, current medications, past medical history, family and social history, and problem list.  Patient is a nonsmoker.  Past Medical History  Diagnosis Date  . PCOS (polycystic ovarian syndrome)   . Anxiety   . Asthma   . Vertigo   . Prediabetes   . Obesity   . Hypertension     no rx meds  . Seasonal allergies   . Back pain     tx with OTC meds    ROS as above otherwise neg.    Medications reviewed. Current Outpatient Prescriptions  Medication Sig Dispense Refill  . albuterol (PROVENTIL HFA;VENTOLIN HFA) 108 (90 BASE) MCG/ACT inhaler Inhale 1-2 puffs into the lungs every 6 (six) hours as needed for wheezing.  1 Inhaler  0  . b complex vitamins tablet Take 2 tablets by mouth daily.       . busPIRone (BUSPAR) 15 MG tablet Take 15 mg by mouth 3 (three) times daily.        Dewayne Shorter Seed OIL Take 2 capsules by mouth daily. Takes for BP      . cetirizine (ZYRTEC ALLERGY) 10 MG tablet Take 1 tablet (10 mg total) by mouth daily.  30 tablet  6  . Cholecalciferol (VITAMIN D3) 5000 UNITS TABS Take 1 tablet by mouth every morning.      . citalopram (CELEXA) 20 MG tablet Take 20 mg by mouth  2 (two) times daily. She is not weaning this med.      . clonazePAM (KLONOPIN) 1 MG tablet Take 1.25 mg by mouth 3 (three) times daily as needed.       Marland Kitchen exenatide (BYETTA) 10 MCG/0.04ML SOLN Inject 20 mcg into the skin daily.       . fluticasone (FLONASE) 50 MCG/ACT nasal spray USE 2 SPRAYS IN EACH NOSTRIL DAILY  16 g  5  . Fluticasone-Salmeterol (ADVAIR DISKUS) 100-50 MCG/DOSE AEPB Inhale 1 puff into the lungs 2 (two) times daily.  60 each  11  . ketotifen (ZADITOR) 0.025 % ophthalmic solution 1 drop 2 (two) times daily.      . meclizine (ANTIVERT) 25 MG tablet Take 1 tablet (25 mg total) by mouth 3 (three) times daily as needed.  30 tablet  1  . metFORMIN (GLUCOPHAGE) 500 MG tablet Take 500 mg by mouth 2 (two) times daily with a meal. Per Dr. Vincente Poli- OBGYN      . Prenatal Vit-Fe Fumarate-FA (PRENATAL PO) Take 1 tablet by mouth daily.      . VENTOLIN HFA 108 (90 BASE) MCG/ACT inhaler INHALE 2 PUFFS INTO THE LUNGS EVERY 6 (SIX) HOURS AS NEEDED.  1  each  11   No current facility-administered medications for this visit.    Exam: BP 139/87  Pulse 94  Temp(Src) 98 F (36.7 C) (Oral)  Wt 345 lb (156.491 kg)  BMI 55.71 kg/m2  SpO2 94% Gen: Well NAD HEENT: EOMI,  MMM Lungs: CTABL Nl WOB Heart: RRR no MRG Abd: NABS, NT, ND Exts: trace BL  LE, warm and well perfused.   No results found for this or any previous visit (from the past 72 hour(s)).

## 2013-06-12 NOTE — Addendum Note (Signed)
Addended by: Konrad Dolores, DAVID J on: 06/12/2013 01:24 PM   Modules accepted: Orders

## 2013-06-12 NOTE — Assessment & Plan Note (Signed)
Starting propranolol (also for palpitations and anxiousness) Numerous htn readings at home

## 2013-06-12 NOTE — Patient Instructions (Addendum)
Thank you for coming in today You are doing well overall but I would like for you to wear a halter monitor for 1 week to look for any arrythmias [name] should be calling you for this. Please start the propranolol after you are done wearing the halter monitor. Please continue to work on your diet and weight loss. Also please increase your aerobic exercise as this will help with your shortness of breath. The propanolol will also likely help with your shortness of breath if it is related to your heart Please come back anytime with any further questions or concerns

## 2013-06-18 ENCOUNTER — Ambulatory Visit: Payer: 59 | Admitting: Cardiology

## 2013-08-08 ENCOUNTER — Emergency Department (HOSPITAL_COMMUNITY): Payer: 59

## 2013-08-08 ENCOUNTER — Encounter (HOSPITAL_COMMUNITY): Payer: Self-pay

## 2013-08-08 ENCOUNTER — Emergency Department (HOSPITAL_COMMUNITY)
Admission: EM | Admit: 2013-08-08 | Discharge: 2013-08-08 | Disposition: A | Discharge status: Home / Self Care | Payer: 59 | Attending: Emergency Medicine | Admitting: Emergency Medicine

## 2013-08-08 DIAGNOSIS — F411 Generalized anxiety disorder: Secondary | ICD-10-CM | POA: Insufficient documentation

## 2013-08-08 DIAGNOSIS — Z79899 Other long term (current) drug therapy: Secondary | ICD-10-CM | POA: Insufficient documentation

## 2013-08-08 DIAGNOSIS — R209 Unspecified disturbances of skin sensation: Secondary | ICD-10-CM | POA: Insufficient documentation

## 2013-08-08 DIAGNOSIS — J45909 Unspecified asthma, uncomplicated: Secondary | ICD-10-CM | POA: Insufficient documentation

## 2013-08-08 DIAGNOSIS — R110 Nausea: Secondary | ICD-10-CM | POA: Insufficient documentation

## 2013-08-08 DIAGNOSIS — F419 Anxiety disorder, unspecified: Secondary | ICD-10-CM

## 2013-08-08 DIAGNOSIS — E669 Obesity, unspecified: Secondary | ICD-10-CM | POA: Insufficient documentation

## 2013-08-08 DIAGNOSIS — R0602 Shortness of breath: Secondary | ICD-10-CM | POA: Insufficient documentation

## 2013-08-08 DIAGNOSIS — R0789 Other chest pain: Secondary | ICD-10-CM | POA: Insufficient documentation

## 2013-08-08 DIAGNOSIS — Z8639 Personal history of other endocrine, nutritional and metabolic disease: Secondary | ICD-10-CM | POA: Insufficient documentation

## 2013-08-08 DIAGNOSIS — R630 Anorexia: Secondary | ICD-10-CM | POA: Insufficient documentation

## 2013-08-08 DIAGNOSIS — I10 Essential (primary) hypertension: Secondary | ICD-10-CM | POA: Insufficient documentation

## 2013-08-08 DIAGNOSIS — R079 Chest pain, unspecified: Secondary | ICD-10-CM

## 2013-08-08 DIAGNOSIS — Z862 Personal history of diseases of the blood and blood-forming organs and certain disorders involving the immune mechanism: Secondary | ICD-10-CM | POA: Insufficient documentation

## 2013-08-08 DIAGNOSIS — R42 Dizziness and giddiness: Secondary | ICD-10-CM | POA: Insufficient documentation

## 2013-08-08 LAB — BASIC METABOLIC PANEL
CO2: 25 mEq/L (ref 19–32)
Calcium: 9.6 mg/dL (ref 8.4–10.5)
Chloride: 103 mEq/L (ref 96–112)
Creatinine, Ser: 0.77 mg/dL (ref 0.50–1.10)
GFR calc Af Amer: >90 mL/min (ref >90)
GFR calc non Af Amer: >90 mL/min (ref >90)
Potassium: 3.8 mEq/L (ref 3.5–5.1)
Sodium: 141 mEq/L (ref 135–145)

## 2013-08-08 LAB — CBC
MCH: 28.7 pg (ref 26.0–34.0)
MCHC: 33.1 g/dL (ref 30.0–36.0)
MCV: 86.9 fL (ref 78.0–100.0)
Platelets: 383 10*3/uL (ref 150–400)
RBC: 4.28 MIL/uL (ref 3.87–5.11)
RDW: 13.7 % (ref 11.5–15.5)

## 2013-08-08 LAB — POCT I-STAT TROPONIN I: Troponin i, poc: 0.11 ng/mL (ref 0.00–0.08)

## 2013-08-08 MED ORDER — MECLIZINE HCL 25 MG PO TABS: 25.0000 mg | ORAL_TABLET | Freq: Once | ORAL | Status: DC

## 2013-08-08 NOTE — ED Notes (Signed)
I-stat Troponin result shown to Dr Deretha Emory.

## 2013-08-08 NOTE — ED Notes (Signed)
Pt tearful at this time.  St's she thinks she is having anxiety.  Husband at bedside.

## 2013-08-08 NOTE — ED Provider Notes (Signed)
CSN: [id number]     Arrival date & time 08/08/13  1827 History   First MD Initiated Contact with Patient 08/08/13 1839     Chief Complaint  Patient presents with  . Chest Tightness   . Anxiety   (Consider location/radiation/quality/duration/timing/severity/associated sxs/prior Treatment) The history is provided by the patient and the spouse.   32 year old female. Brought in by [facility]. Patient has a history of anxiety and panic attacks. Today's episode started out like a panic attack. She frequently gets some mild chest discomfort with that. Onset of this was 11:00 today. By 2:30 in the afternoon she had pressure in the chest it went around to the back which was different associated with shortness of breath some dizziness and vertigo she has had vertigo with a panic attacks in the past. Chest pressure was mild and really call it pain but it would be a 5/10. She felt lightheaded was concerned that she was going to pass out this occurred a few times. Nothing made it better or worse.  Past Medical History  Diagnosis Date  . PCOS (polycystic ovarian syndrome)   . Anxiety   . Asthma   . Vertigo   . Prediabetes   . Obesity   . Hypertension     no rx meds  . Seasonal allergies   . Back pain     tx with OTC meds   Past Surgical History  Procedure Laterality Date  . Wisdom tooth extraction    . Multiple tooth extractions      for braces  . Svd      x 1  . Dilation and evacuation  03/17/2012    Procedure: DILATATION AND EVACUATION;  Surgeon: Jeani Hawking, MD;  Location: [facility];  Service: Gynecology;  Laterality: N/A;   Family History  Problem Relation Age of Onset  . Parkinsonism Father   . Cancer Neg Hx   . Alcohol abuse Neg Hx   . Depression Neg Hx   . Diabetes Neg Hx   . Hypertension Neg Hx   . Hyperlipidemia Neg Hx   . Heart attack Maternal Grandfather    History  Substance Use Topics  . Smoking status: Never Smoker   . Smokeless tobacco: Never Used  .  Alcohol Use: No   OB History   Grav Para Term Preterm Abortions TAB SAB Ect Mult Living   2 1 1  1  1   1      Review of Systems  Constitutional: Positive for appetite change.  HENT: Negative for congestion and neck pain.   Cardiovascular: Positive for chest pain.  Gastrointestinal: Positive for nausea. Negative for vomiting and abdominal pain.  Genitourinary: Negative for dysuria.  Musculoskeletal: Negative for back pain.  Skin: Negative for rash.  Neurological: Positive for dizziness and numbness.  Hematological: Does not bruise/bleed easily.  Psychiatric/Behavioral: Negative for confusion. The patient is nervous/anxious.     Allergies  Review of patient's allergies indicates no known allergies.  Home Medications   Current Outpatient Rx  Name  Route  Sig  Dispense  Refill  . b complex vitamins tablet   Oral   Take 2 tablets by mouth daily.          . busPIRone (BUSPAR) 15 MG tablet   Oral   Take 15 mg by mouth 3 (three) times daily.           Dewayne Shorter Seed OIL   Oral   Take 2 capsules by mouth daily.  Takes for BP         . Chlorophyll (CHLOROXYGEN PO)   Oral   Take 1 tablet by mouth 2 (two) times daily.         . Cholecalciferol (VITAMIN D3) 5000 UNITS TABS   Oral   Take 1 tablet by mouth every morning.         . citalopram (CELEXA) 20 MG tablet   Oral   Take 20 mg by mouth 2 (two) times daily. She is not weaning this med.         . clonazePAM (KLONOPIN) 1 MG tablet   Oral   Take 1.25 mg by mouth 3 (three) times daily as needed.          Marland Kitchen exenatide (BYETTA) 10 MCG/0.04ML SOLN   Subcutaneous   Inject 20 mcg into the skin daily.          . Fluticasone-Salmeterol (ADVAIR DISKUS) 100-50 MCG/DOSE AEPB   Inhalation   Inhale 1 puff into the lungs 2 (two) times daily.   60 each   11   . ketotifen (ZADITOR) 0.025 % ophthalmic solution      1 drop 2 (two) times daily.         . meclizine (ANTIVERT) 25 MG tablet   Oral   Take 1 tablet  (25 mg total) by mouth 3 (three) times daily as needed.   30 tablet   1   . metFORMIN (GLUCOPHAGE) 1000 MG tablet   Oral   Take 1,000 mg by mouth daily with breakfast.         . Prenatal Vit-Fe Fumarate-FA (PRENATAL PO)   Oral   Take 1 tablet by mouth daily.         . VENTOLIN HFA 108 (90 BASE) MCG/ACT inhaler      INHALE 2 PUFFS INTO THE LUNGS EVERY 6 (SIX) HOURS AS NEEDED.   1 each   11   . EXPIRED: cetirizine (ZYRTEC ALLERGY) 10 MG tablet   Oral   Take 1 tablet (10 mg total) by mouth daily.   30 tablet   6   . propranolol (INDERAL) 10 MG tablet   Oral   Take 1 tablet (10 mg total) by mouth 3 (three) times daily.   90 tablet   3    BP 122/69  Pulse 72  Temp(Src) 98 F (36.7 C) (Oral)  Resp 16  Ht 5\' 6"  (1.676 m)  Wt 345 lb (156.491 kg)  BMI 55.71 kg/m2  SpO2 99%  LMP 08/08/2013 Physical Exam  Nursing note and vitals reviewed. Constitutional: She is oriented to person, place, and time. She appears well-developed and well-nourished. No distress.  HENT:  Head: Normocephalic and atraumatic.  Mouth/Throat: Oropharynx is clear and moist.  Eyes: Conjunctivae and EOM are normal. Pupils are equal, round, and reactive to light.  Neck: Normal range of motion. Neck supple.  Cardiovascular: Normal rate and regular rhythm.   No murmur heard. Pulmonary/Chest: Effort normal and breath sounds normal. No respiratory distress.  Abdominal: Soft. Bowel sounds are normal. There is no tenderness.  Musculoskeletal: Normal range of motion.  Neurological: She is alert and oriented to person, place, and time. No cranial nerve deficit. She exhibits normal muscle tone. Coordination normal.  Skin: Skin is warm. No rash noted.    ED Course  Procedures (including critical care time) Labs Review Labs Reviewed  CBC - Abnormal; Notable for the following:    WBC 10.6 (*)    All other  components within normal limits  BASIC METABOLIC PANEL - Abnormal; Notable for the following:     Glucose, Bld 102 (*)    All other components within normal limits  POCT I-STAT TROPONIN I - Abnormal; Notable for the following:    Troponin i, poc 0.11 (*)    All other components within normal limits  TROPONIN I   Imaging Review Ct Head Wo Contrast  08/08/2013   CLINICAL DATA:  Mid chest tightness radiating to the jaw. Nausea with anxiety.  EXAM: CT HEAD WITHOUT CONTRAST  TECHNIQUE: Contiguous axial images were obtained from the base of the skull through the vertex without intravenous contrast.  COMPARISON:  None.  FINDINGS: There is no evidence of acute intracranial hemorrhage, mass lesion, brain edema or extra-axial fluid collection. The ventricles and subarachnoid spaces are appropriately sized for age. There is no CT evidence of acute cortical infarction.  The visualized paranasal sinuses, mastoid air cells and middle ears are clear. The calvarium is intact.  IMPRESSION: Negative noncontrast head CT.   Electronically Signed   By: Roxy Horseman   On: 08/08/2013 20:48   Dg Chest Port 1 View  08/08/2013   CLINICAL DATA:  Chest pain  EXAM: PORTABLE CHEST - 1 VIEW  COMPARISON:  None.  FINDINGS: Borderline cardiomegaly. No acute infiltrate or pleural effusion. No pulmonary edema. Mild basilar atelectasis.  IMPRESSION: Borderline cardiomegaly. Mild basilar atelectasis. No pulmonary edema.   Electronically Signed   By: Natasha Mead   On: 08/08/2013 18:56    Date: 08/08/2013  Rate: 80  Rhythm: normal sinus rhythm  QRS Axis: normal  Intervals: normal  ST/T Wave abnormalities: normal  Conduction Disutrbances:none  Narrative Interpretation:   Old EKG Reviewed: unchanged Unchanged from 06/12/2013   MDM   1. Anxiety   2. Chest pain   3. Vertigo    Patient's repeat troponin is a non-point-of-care test was normal. Patient's EKG without any significant changes. Patient slowly improved here in the emergency department. Patient has a history of panic attacks anxiety this started out like that then  she did develop a few other symptoms. She's also had some vertigo that does occur when these panic attacks occur. Chest x-rays negative for pneumonia pneumothorax or pulmonary edema. Labs without any significant abnormalities. Patient is followed by family practice here at [facility] she has followup arranged. Patient will return for any newer worse symptoms.    Shelda Jakes, MD 08/08/13 [phone]

## 2013-08-08 NOTE — ED Notes (Signed)
Unable to complete EKG <58min b/c pt insisted on using the restroom as soon as she arrived

## 2013-08-08 NOTE — ED Notes (Signed)
Pt ambulatory to bathroom without any problems.  Pt to CT at this time

## 2013-08-08 NOTE — ED Notes (Signed)
Patient presents from home via [facility] for midchest tightness that radiates to the jaw, head pressure, nausea and anxiety. Patient actually reports that she has anxiety and is unsure if the chest tightness is due to her heart or the anxiety. Symptoms began today about 20-30 minutes prior to EMS being dispatched at 1700. EKG unremarkable. ASA 324 mg given. Zofran 4 mg IVP to 20g R. Hand. Patient refused Nitro. BP 147/77 HR 88 RR 18 SPO2 97% RA

## 2013-08-13 ENCOUNTER — Encounter: Payer: Self-pay | Admitting: Family Medicine

## 2013-08-15 ENCOUNTER — Encounter: Payer: Self-pay | Admitting: Family Medicine

## 2013-08-15 ENCOUNTER — Ambulatory Visit (INDEPENDENT_AMBULATORY_CARE_PROVIDER_SITE_OTHER): Payer: 59 | Admitting: Family Medicine

## 2013-08-15 VITALS — BP 141/93 | HR 96 | Ht 66.0 in | Wt 343.0 lb

## 2013-08-15 DIAGNOSIS — N390 Urinary tract infection, site not specified: Secondary | ICD-10-CM | POA: Insufficient documentation

## 2013-08-15 DIAGNOSIS — R300 Dysuria: Secondary | ICD-10-CM

## 2013-08-15 LAB — POCT URINALYSIS DIPSTICK
Bilirubin, UA: NEGATIVE
Blood, UA: NEGATIVE
Protein, UA: NEGATIVE
Spec Grav, UA: >=1.03
pH, UA: 5.5

## 2013-08-15 LAB — POCT UA - MICROSCOPIC ONLY

## 2013-08-15 MED ORDER — CEPHALEXIN 500 MG PO CAPS: 500.0000 mg | ORAL_CAPSULE | Freq: Four times a day (QID) | ORAL | Status: DC

## 2013-08-15 NOTE — Patient Instructions (Addendum)
Thank you for coming in, today!  Your urine looks like there might be an early infection. It's worth treating it while we wait on a urine culture. I will prescribe Keflex (cephalexin) for 5 days, 4 times a day. I will call you if we need to change antibiotics. If you don't hear from me, assume no news is good news. You can take Tylenol for pain or any fevers. Over-the-counter medicine like "AZO" products may help with your symptoms. Make sure you drink extra fluids.  If things get worse instead of better, or if they don't change, come back sooner rather than later. Otherwise, keep your appointment with the heart doctors. Come back to see your regular doctor, Dr. Lum Babe in the next month or two.  Please feel free to call with any questions or concerns at any time, at [phone]. --Dr. Casper Harrison

## 2013-08-16 NOTE — Assessment & Plan Note (Signed)
Symptoms suggestive of cystitis, though renal stone or other intraabdominal process is possible. UA shows concentrated urine with trace leukocytes and rare RBC on microscopy. Culture collected and Rx given for Keflex QID for 5 days. Will contact pt with results of urine culture if organism grows that requires change in abx. Also discussed red flags that would prompt immediate return to care, as well as potential use of AZO products, supportive care with Tylenol and good hydration, etc. Follow up PRN.

## 2013-08-16 NOTE — Progress Notes (Signed)
  Subjective:    Patient ID: Amanda Massey, female    DOB: 04-22-1981, 32 y.o.   MRN: [id number]  HPI: Pt presents to clinic for increased urinary frequency for about two days, with concern that she may have a UTI. Pt reports that through the day yesterday/today, she also had onset of low back pain with some radiation into her groin, and more crampy discomfort with urination. Pt also noticed a strong odor to her urine today. Pt has been drinking more water than normal, without muchchange in symptoms. Pt states she has had UTI's before with similar symptoms; pt states she has "let them go" before seeking treatment before and "ended up getting very sick" but has never been admitted due to UTI. Pt endorses chronic nausea that she associates with [place] that is not new, and denies vomiting, fever/chills. Does endorse feeling more tired, "run down, ill-feeling" since onset of her urinary symptoms. Denies vaginal discharge, bleeding, itching/burning.  Review of Systems: As above. Generally feels "okay," without further complaints. Of note pt had recent ED visit for "severe panic attack" and has questions about possibly starting a beta blocker, but has an appointment with cardiology on 10/3 and will discuss further with them.     Objective:   Physical Exam BP 141/93  Pulse 96  Ht 5\' 6"  (1.676 m)  Wt 343 lb (155.584 kg)  BMI 55.39 kg/m2  LMP 08/08/2013 Gen: well-appearing adult female, in NAD HEENT: [place]/AT, EMOI, MMM Cardio: RRR, no murmur appreciated Pulm: CTAB, no wheezes Abd: soft, nondistended, obese  Mild/vague right flank tenderness, otherwise diffuse right-sided right abdominal "irritation" on palpation  More distinct tenderness suprapubically  No frank abdominal masses, no CVA tenderness, no epigastric tenderness; BS+ Ext: warm, well-perfused, no frank rashes, no LE edema Neuro: alert/oriented, calm/euthymic with congruent normal affect, no obvious distress     Assessment & Plan:

## 2013-08-17 ENCOUNTER — Ambulatory Visit: Payer: 59 | Admitting: Cardiology

## 2013-08-17 LAB — URINE CULTURE

## 2013-09-20 ENCOUNTER — Other Ambulatory Visit: Payer: Self-pay

## 2013-12-01 ENCOUNTER — Emergency Department (HOSPITAL_COMMUNITY)
Admission: EM | Admit: 2013-12-01 | Discharge: 2013-12-01 | Disposition: A | Discharge status: Home / Self Care | Payer: 59 | Source: Home / Self Care | Attending: Emergency Medicine | Admitting: Emergency Medicine

## 2013-12-01 ENCOUNTER — Encounter (HOSPITAL_COMMUNITY): Payer: Self-pay | Admitting: Emergency Medicine

## 2013-12-01 DIAGNOSIS — J069 Acute upper respiratory infection, unspecified: Secondary | ICD-10-CM

## 2013-12-01 DIAGNOSIS — J019 Acute sinusitis, unspecified: Secondary | ICD-10-CM

## 2013-12-01 DIAGNOSIS — J45909 Unspecified asthma, uncomplicated: Secondary | ICD-10-CM

## 2013-12-01 MED ORDER — HYDROCOD POLST-CHLORPHEN POLST 10-8 MG/5ML PO LQCR: 5.0000 mL | Freq: Two times a day (BID) | ORAL | Status: DC | PRN

## 2013-12-01 MED ORDER — AMOXICILLIN-POT CLAVULANATE 875-125 MG PO TABS: 1.0000 | ORAL_TABLET | Freq: Two times a day (BID) | ORAL | Status: DC

## 2013-12-01 MED ORDER — PREDNISONE 20 MG PO TABS: 20.0000 mg | ORAL_TABLET | Freq: Two times a day (BID) | ORAL | Status: DC

## 2013-12-01 NOTE — ED Notes (Signed)
Pt c/o cold sxs onset Wednesday w/sxs that include: productive cough and feeling nauseas Denies: f/v/n/d, SOB, wheezing... Hx of asthma She is alert and talking in complete sentences w/no signs of acute distress.

## 2013-12-01 NOTE — ED Provider Notes (Signed)
Chief Complaint   Chief Complaint  Patient presents with  . URI    History of Present Illness   Amanda Massey is a 33 year old female who had a flulike illness about 2 weeks ago. This then got better. For the past 4 days she's had nasal congestion without any drainage, headache, and sinus pressure. She's had a dry cough, wheezing, shortness of breath, and chest tightness. She has some chest pain when she coughs. She's had hoarseness, sore throat, and dizziness. She denies any fever or chills.  Review of Systems   Other than as noted above, the patient denies any of the following symptoms: Systemic:  No fevers, chills, sweats, or myalgias. Eye:  No redness or discharge. ENT:  No ear pain, headache, nasal congestion, drainage, sinus pressure, or sore throat. Neck:  No neck pain, stiffness, or swollen glands. Lungs:  No cough, sputum production, hemoptysis, wheezing, chest tightness, shortness of breath or chest pain. GI:  No abdominal pain, nausea, vomiting or diarrhea.  PMFSH   Past medical history, family history, social history, meds, and allergies were reviewed. She has no medication allergies. She has asthma, anxiety, prediabetes, hypertension, seasonal allergies and polycystic ovarian syndrome. Current meds include BuSpar, Klonopin, Byetta, Advair Diskus, Zyrtec, vitamin D 3, Celexa, metformin, propranolol, and Ventolin.  Physical exam   Vital signs:  BP 161/80  Pulse 105  Temp(Src) 98.1 F (36.7 C) (Oral)  Resp 20  SpO2 100%  LMP 09/24/2013 General:  Alert and oriented.  In no distress.  Skin warm and dry. Eye:  No conjunctival injection or drainage. Lids were normal. ENT:  TMs and canals were normal, without erythema or inflammation.  Nasal mucosa was clear and uncongested, without drainage.  Mucous membranes were moist.  Pharynx was clear with no exudate or drainage.  There were no oral ulcerations or lesions. Neck:  Supple, no adenopathy, tenderness or  mass. Lungs:  No respiratory distress.  Lungs were clear to auscultation, without wheezes, rales or rhonchi.  Breath sounds were clear and equal bilaterally.  Heart:  Regular rhythm, without gallops, murmers or rubs. Skin:  Clear, warm, and dry, without rash or lesions.   Assessment     The primary encounter diagnosis was Viral upper respiratory infection. Diagnoses of Acute sinusitis and Asthma were also pertinent to this visit.  Plan    1.  Meds:  The following meds were prescribed:   Discharge Medication List as of 12/01/2013  2:22 PM    START taking these medications   Details  amoxicillin-clavulanate (AUGMENTIN) 875-125 MG per tablet Take 1 tablet by mouth 2 (two) times daily., Starting 12/01/2013, Until Discontinued, Normal    chlorpheniramine-HYDROcodone (TUSSIONEX) 10-8 MG/5ML LQCR Take 5 mLs by mouth every 12 (twelve) hours as needed for cough., Starting 12/01/2013, Until Discontinued, Normal    predniSONE (DELTASONE) 20 MG tablet Take 1 tablet (20 mg total) by mouth 2 (two) times daily., Starting 12/01/2013, Until Discontinued, Normal        2.  Patient Education/Counseling:  The patient was given appropriate handouts, self care instructions, and instructed in symptomatic relief.  Instructed to get extra fluids, rest, and use a cool mist vaporizer.   3.  Follow up:  The patient was told to follow up here if no better in 3 to 4 days, or sooner if becoming worse in any way, and given some red flag symptoms such as increasing fever, difficulty breathing, chest pain, or persistent vomiting which would prompt immediate return.  Follow up  here as needed.      Reuben Likesavid C Chaim Gatley, MD 12/01/13 2116

## 2013-12-01 NOTE — ED Notes (Signed)
Bed: UC10 Expected date:  Expected time:  Means of arrival:  Comments: 

## 2013-12-01 NOTE — Discharge Instructions (Signed)
Most upper respiratory infections are caused by viruses and do not require antibiotics.  We try to save the antibiotics for when we really need them to prevent bacteria from developing resistance to them.  Here are a few hints about things that can be done at home to help get over an upper respiratory infection quicker: ° °Get extra sleep and extra fluids.  Get 7 to 9 hours of sleep per night and 6 to 8 glasses of water a day.  Getting extra sleep keeps the immune system from getting run down.  Most people with an upper respiratory infection are a little dehydrated.  The extra fluids also keep the secretions liquified and easier to deal with.  Also, get extra vitamin C.  4000 mg per day is the recommended dose. °For the aches, headache, and fever, acetaminophen or ibuprofen are helpful.  These can be alternated every 4 hours.  People with liver disease should avoid large amounts of acetaminophen, and people with ulcer disease, gastroesophageal reflux, gastritis, congestive heart failure, chronic kidney disease, coronary artery disease and the elderly should avoid ibuprofen. °For nasal congestion try Mucinex-D, or if you're having lots of sneezing or clear nasal drainage use Zyrtec-D. People with high blood pressure can take these if their blood pressure is controlled, if not, it's best to avoid the forms with a "D" (decongestants).  You can use the plain Mucinex, Allegra, Claritin, or Zyrtec even if your blood pressure is not controlled.   °A Saline nasal spray such as Ocean Spray can also help.  You can add a decongestant sprays such as Afrin, but you should not use the decongestant sprays for more than 3 or 4 days since they can be habituating.  Breathe Rite nasal strips can also offer a non-drug alternative treatment to nasal congestion, especially at night. °For people with symptoms of sinusitis, sleeping with your head elevated can be helpful.  For sinus pain, moist, hot compresses to the face may provide some  relief.  Many people find that inhaling steam as in a shower or from a pot of steaming water can help. °For any viral infection, zinc containing lozenges such as Cold-Eze or Zicam are helpful.  Zinc helps to fight viral infection.  Hot salt water gargles (8 oz of hot water, 1/2 tsp of table salt, and a pinch of baking soda) can give relief as well as hot beverages such as hot tea.  Sucrets extra strength lozenges will help the sore throat.  °For the cough, take Delsym 2 tsp every 12 hours.  It has also been found recently that Aleve can help control a cough.  The dose is 1 to 2 tablets twice daily with food.  This can be combined with Delsym. (Note, if you are taking ibuprofen, you should not take Aleve as well--take one or the other.) °A cool mist vaporizer will help keep your mucous membranes from drying out.  ° °It's important when you have an upper respiratory infection not to pass the infection to others.  This involves being very careful about the following: ° °Frequent hand washing or use of hand sanitizer, especially after coughing, sneezing, blowing your nose or touching your face, nose or eyes. °Do not shake hands or touch anyone and try to avoid touching surfaces that other people use such as doorknobs, shopping carts, telephones and computer keyboards. °Use tissues and dispose of them properly in a garbage can or ziplock bag. °Cough into your sleeve. °Do not let others eat or   drink after you. ° °It's also important to recognize the signs of serious illness and get evaluated if they occur: °Any respiratory infection that lasts more than 7 to 10 days.  Yellow nasal drainage and sputum are not reliable indicators of a bacterial infection, but if they last for more than 1 week, see your doctor. °Fever and sore throat can indicate strep. °Fever and cough can indicate influenza or pneumonia. °Any kind of severe symptom such as difficulty breathing, intractable vomiting, or severe pain should prompt you to see  a doctor as soon as possible. ° ° °Your body's immune system is really the thing that will get rid of this infection.  Your immune system is comprised of 2 types of specialized cells called T cells and B cells.  T cells coordinate the array of cells in your body that engulf invading bacteria or viruses while B cells orchestrate the production of antibodies that neutralize infection.  Anything we do or any medications we give you, will just strengthen your immune system or help it clear up the infection quicker.  Here are a few helpful hints to improve your immune system to help overcome this illness or to prevent future infections: °· A few vitamins can improve the health of your immune system.  That's why your diet should include plenty of fruits, vegetables, fish, nuts, and whole grains. °· Vitamin A and bet-carotene can increase the cells that fight infections (T cells and B cells).  Vitamin A is abundant in dark greens and orange vegetables such as spinach, greens, sweet potatoes, and carrots. °· Vitamin B6 contributes to the maturation of white blood cells, the cells that fight disease.  Foods with vitamin B6 include cold cereal and bananas. °· Vitamin C is credited with preventing colds because it increases white blood cells and also prevents cellular damage.  Citrus fruits, peaches and green and red bell peppers are all hight in vitamin C. °· Vitamin E is an anti-oxidant that encourages the production of natural killer cells which reject foreign invaders and B cells that produce antibodies.  Foods high in vitamin E include wheat germ, nuts and seeds. °· Foods high in omega-3 fatty acids found in foods like salmon, tuna and mackerel boost your immune system and help cells to engulf and absorb germs. °· Probiotics are good bacteria that increase your T cells.  These can be found in yogurt and are available in supplements such as Culturelle or Align. °· Moderate exercise increases the strength of your immune  system and your ability to recover from illness.  I suggest 3 to 5 moderate intensity 30 minute workouts per week.   °· Sleep is another component of maintaining a strong immune system.  It enables your body to recuperate from the day's activities, stress and work.  My recommendation is to get between 7 and 9 hours of sleep per night. °· If you smoke, try to quit completely or at least cut down.  Drink alcohol only in moderation if at all.  No more than 2 drinks daily for men or 1 for women. °· Get a flu vaccine early in the fall or if you have not gotten one yet, once this illness has run its course.  If you are over 65, a smoker, or an asthmatic, get a pneumococcal vaccine. °· My final recommendation is to maintain a healthy weight.  Excess weight can impair the immune system by interfering with the way the immune system deals with invading viruses or   bacteria. °·  °Asthma Attack Prevention °Although there is no way to prevent asthma from starting, you can take steps to control the disease and reduce its symptoms. Learn about your asthma and how to control it. Take an active role to control your asthma by working with your health care provider to create and follow an asthma action plan. An asthma action plan guides you in: °Taking your medicines properly. °Avoiding things that set off your asthma or make your asthma worse (asthma triggers). °Tracking your level of asthma control. °Responding to worsening asthma. °Seeking emergency care when needed. °To track your asthma, keep records of your symptoms, check your peak flow number using a handheld device that shows how well air moves out of your lungs (peak flow meter), and get regular asthma checkups.  °WHAT ARE SOME WAYS TO PREVENT AN ASTHMA ATTACK? °Take medicines as directed by your health care provider. °Keep track of your asthma symptoms and level of control. °With your health care provider, write a detailed plan for taking medicines and managing an asthma  attack. Then be sure to follow your action plan. Asthma is an ongoing condition that needs regular monitoring and treatment. °Identify and avoid asthma triggers. Many outdoor allergens and irritants (such as pollen, mold, cold air, and air pollution) can trigger asthma attacks. Find out what your asthma triggers are and take steps to avoid them. °Monitor your breathing. Learn to recognize warning signs of an attack, such as coughing, wheezing, or shortness of breath. Your lung function may decrease before you notice any signs or symptoms, so regularly measure and record your peak airflow with a home peak flow meter. °Identify and treat attacks early. If you act quickly, you are less likely to have a severe attack. You will also need less medicine to control your symptoms. When your peak flow measurements decrease and alert you to an upcoming attack, take your medicine as instructed and immediately stop any activity that may have triggered the attack. If your symptoms do not improve, get medical help. °Pay attention to increasing quick-relief inhaler use. If you find yourself relying on your quick-relief inhaler, your asthma is not under control. See your health care provider about adjusting your treatment. °WHAT CAN MAKE MY SYMPTOMS WORSE? °A number of common things can set off or make your asthma symptoms worse and cause temporary increased inflammation of your airways. Keep track of your asthma symptoms for several weeks, detailing all the environmental and emotional factors that are linked with your asthma. When you have an asthma attack, go back to your asthma diary to see which factor, or combination of factors, might have contributed to it. Once you know what these factors are, you can take steps to control many of them. If you have allergies and asthma, it is important to take asthma prevention steps at home. Minimizing contact with the substance to which you are allergic will help prevent an asthma attack.  Some triggers and ways to avoid these triggers are: °Animal Dander:  °Some people are allergic to the flakes of skin or dried saliva from animals with fur or feathers.  °There is no such thing as a hypoallergenic dog or cat breed. All dogs or cats can cause allergies, even if they don't shed. °Keep these pets out of your home. °If you are not able to keep a pet outdoors, keep the pet out of your bedroom and other sleeping areas at all times, and keep the door closed. °Remove carpets and furniture covered   other sleeping areas at all times, and keep the door closed.  Remove carpets and furniture covered with cloth from your home. If that is not possible, keep the pet away from fabric-covered furniture and carpets. Dust Mites: Many people with asthma are allergic to dust mites. Dust mites are tiny bugs that are found in every home in mattresses, pillows, carpets, fabric-covered furniture, bedcovers, clothes, stuffed toys, and other fabric-covered items.   Cover your mattress in a special dust-proof cover.  Cover your pillow in a special dust-proof cover, or wash the pillow each week in hot water. Water must be hotter than 130 F (54.4 C) to kill dust mites. Cold or warm water used with detergent and bleach can also be effective.  Wash the sheets and blankets on your bed each week in hot water.  Try not to sleep or lie on cloth-covered cushions.  Call ahead when traveling and ask for a smoke-free hotel room. Bring your own bedding and pillows in case the hotel only supplies feather pillows and down comforters, which may contain dust mites and cause asthma symptoms.  Remove carpets from your bedroom and those laid on concrete, if you can.  Keep stuffed toys out of the bed, or wash the toys weekly in hot water or cooler water with detergent and bleach. Cockroaches: Many people with asthma are allergic to the droppings and remains of cockroaches.   Keep food and garbage in closed containers. Never leave food out.  Use poison baits, traps, powders, gels, or paste  (for example, boric acid).  If a spray is used to kill cockroaches, stay out of the room until the odor goes away. Indoor Mold:  Fix leaky faucets, pipes, or other sources of water that have mold around them.  Clean floors and moldy surfaces with a fungicide or diluted bleach.  Avoid using humidifiers, vaporizers, or swamp coolers. These can spread molds through the air. Pollen and Outdoor Mold:  When pollen or mold spore counts are high, try to keep your windows closed.  Stay indoors with windows closed from late morning to afternoon. Pollen and some mold spore counts are highest at that time.  Ask your health care provider whether you need to take anti-inflammatory medicine or increase your dose of the medicine before your allergy season starts. Other Irritants to Avoid:  Tobacco smoke is an irritant. If you smoke, ask your health care provider how you can quit. Ask family members to quit smoking too. Do not allow smoking in your home or car.  If possible, do not use a wood-burning stove, kerosene heater, or fireplace. Minimize exposure to all sources of smoke, including to incense, candles, fires, and fireworks.  Try to stay away from strong odors and sprays, such as perfume, talcum powder, hair spray, and paints.  Decrease humidity in your home and use an indoor air cleaning device. Reduce indoor humidity to below 60%. Dehumidifiers or central air conditioners can do this.  Decrease house dust exposure by changing furnace and air cooler filters frequently.  Try to have someone else vacuum for you once or twice a week. Stay out of rooms while they are being vacuumed and for a short while afterward.  If you vacuum, use a dust mask from a hardware store, a double-layered or microfilter vacuum cleaner bag, or a vacuum cleaner with a HEPA filter.  Sulfites in foods and beverages can be irritants. Do not drink beer or wine or eat dried fruit, processed potatoes, or shrimp if they  cause  asthma symptoms.  Cold air can trigger an asthma attack. Cover your nose and mouth with a scarf on cold or windy days.  Several health conditions can make asthma more difficult to manage, including a runny nose, sinus infections, reflux disease, psychological stress, and sleep apnea. Work with your health care provider to manage these conditions.  Avoid close contact with people who have a respiratory infection such as a cold or the flu, since your asthma symptoms may get worse if you catch the infection. Wash your hands thoroughly after touching items that may have been handled by people with a respiratory infection.  Get a flu shot every year to protect against the flu virus, which often makes asthma worse for days or weeks. Also get a pneumonia shot if you have not previously had one. Unlike the flu shot, the pneumonia shot does not need to be given yearly. Medicines:  Talk to your health care provider about whether it is safe for you to take aspirin or non-steroidal anti-inflammatory medicines (NSAIDs). In a small number of people with asthma, aspirin and NSAIDs can cause asthma attacks. These medicines must be avoided by people who have known aspirin-sensitive asthma. It is important that people with aspirin-sensitive asthma read labels of all over-the-counter medicines used to treat pain, colds, coughs, and fever.  Beta blockers and ACE inhibitors are other medicines you should discuss with your health care provider. HOW CAN I FIND OUT WHAT I AM ALLERGIC TO? Ask your asthma health care provider about allergy skin testing or blood testing (the RAST test) to identify the allergens to which you are sensitive. If you are found to have allergies, the most important thing to do is to try to avoid exposure to any allergens that you are sensitive to as much as possible. Other treatments for allergies, such as medicines and allergy shots (immunotherapy) are available.  CAN I EXERCISE? Follow your  health care provider's advice regarding asthma treatment before exercising. It is important to maintain a regular exercise program, but vigorous exercise, or exercise in cold, humid, or dry environments can cause asthma attacks, especially for those people who have exercise-induced asthma. Document Released: 10/20/2009 Document Revised: 07/04/2013 Document Reviewed: 05/09/2013 [facility] Patient Information 2014 [place], [place].

## 2013-12-05 ENCOUNTER — Ambulatory Visit: Payer: 59 | Admitting: *Deleted

## 2013-12-19 ENCOUNTER — Ambulatory Visit: Payer: 59 | Admitting: *Deleted

## 2013-12-21 ENCOUNTER — Other Ambulatory Visit: Payer: Self-pay | Admitting: *Deleted

## 2013-12-24 MED ORDER — FLUTICASONE-SALMETEROL 100-50 MCG/DOSE IN AEPB: 1.0000 | INHALATION_SPRAY | Freq: Two times a day (BID) | RESPIRATORY_TRACT | Status: DC

## 2013-12-25 ENCOUNTER — Telehealth: Payer: Self-pay | Admitting: *Deleted

## 2013-12-25 NOTE — Telephone Encounter (Signed)
[facility] Advair 100-50 Diskus. Formulary and PA form placed in provider box for completion. Clovis PuMartin, Tamika L, RN

## 2013-12-26 NOTE — Telephone Encounter (Signed)
LMOVM for pt to return call.  Please see below message. Fleeger, Maryjo RochesterJessica Dawn

## 2013-12-26 NOTE — Telephone Encounter (Signed)
I called [employer] for prior authorization, it seem thi medication is not covered even though it is on preferred drug list. They would get back to me for further information, for now please check with patient if she would like to switch over to something else for her Asthma.

## 2013-12-27 ENCOUNTER — Ambulatory Visit: Payer: 59 | Admitting: *Deleted

## 2013-12-28 NOTE — Telephone Encounter (Signed)
Called pt to regarding her Advair.  [facility] and they did not need prior authorization.  Pt picked up Rx a day or two ago.  Clovis PuMartin, Tamika L, RN

## 2014-01-29 ENCOUNTER — Ambulatory Visit: Payer: 59 | Admitting: *Deleted

## 2014-02-04 ENCOUNTER — Ambulatory Visit: Payer: 59 | Admitting: *Deleted

## 2014-02-26 ENCOUNTER — Ambulatory Visit: Payer: 59 | Admitting: *Deleted

## 2014-03-12 ENCOUNTER — Ambulatory Visit: Payer: 59 | Admitting: *Deleted

## 2014-04-04 ENCOUNTER — Other Ambulatory Visit: Payer: Self-pay | Admitting: *Deleted

## 2014-04-04 MED ORDER — ALBUTEROL SULFATE HFA 108 (90 BASE) MCG/ACT IN AERS: 2.0000 | INHALATION_SPRAY | RESPIRATORY_TRACT | Status: DC | PRN

## 2014-04-15 ENCOUNTER — Encounter: Payer: Self-pay | Admitting: *Deleted

## 2014-04-15 ENCOUNTER — Encounter: Payer: 59 | Attending: Family Medicine | Admitting: *Deleted

## 2014-04-15 DIAGNOSIS — R030 Elevated blood-pressure reading, without diagnosis of hypertension: Secondary | ICD-10-CM | POA: Insufficient documentation

## 2014-04-15 DIAGNOSIS — Z713 Dietary counseling and surveillance: Secondary | ICD-10-CM | POA: Insufficient documentation

## 2014-04-15 DIAGNOSIS — F419 Anxiety disorder, unspecified: Secondary | ICD-10-CM

## 2014-04-15 DIAGNOSIS — F341 Dysthymic disorder: Secondary | ICD-10-CM | POA: Insufficient documentation

## 2014-04-15 DIAGNOSIS — F32A Depression, unspecified: Secondary | ICD-10-CM

## 2014-04-15 DIAGNOSIS — R7309 Other abnormal glucose: Secondary | ICD-10-CM | POA: Insufficient documentation

## 2014-04-15 DIAGNOSIS — R7303 Prediabetes: Secondary | ICD-10-CM

## 2014-04-15 DIAGNOSIS — E282 Polycystic ovarian syndrome: Secondary | ICD-10-CM | POA: Insufficient documentation

## 2014-04-15 DIAGNOSIS — E669 Obesity, unspecified: Secondary | ICD-10-CM | POA: Insufficient documentation

## 2014-04-15 DIAGNOSIS — F329 Major depressive disorder, single episode, unspecified: Secondary | ICD-10-CM

## 2014-04-15 NOTE — Patient Instructions (Signed)
Check out My [employer] tv show

## 2014-04-15 NOTE — Progress Notes (Signed)
  Medical Nutrition Therapy:  Appt start time: 1730 end time:  1800.  Assessment:  Primary concerns today: Amanda Massey is here for nutrition counseling.  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  She used to see a RD with [facility] several years ago, but has not seen a dietitian in 2 years.   She would like a fresh perspective.  She had lost 60 pounds through exercise and lifestyle change.  She got pregnant and then miscarried. She gained all the weight back and hasn't been able to commit to making changes since then.  Her husband is really  Supportive, but she is self-conscious about working out with him.  She liked having an accountability partner, but isn't comfortable with him being her accountability partner.  Her coworkers go walking, but that hurts her back.  She likes the recumbent bike Has been trying to conceive for 10 years.  Had 1 miscarriage, but no other pregnancies.  Diagnosed with PCOS in 2009 Has severe anxiety disorder since age 71.  Is working with psychiatrist and switching to Dr. Carmon Ginsberg.  Doesn't have therapist currently, but not good fit.  Has had 2 depressive episodes in her life where she did go to counseling, but doesn't go on regular basis.     Preferred Learning Style:   No preference indicated   Learning Readiness:   Contemplating: cancelled the past 7 nutrition appointments.  Might not be ready for lifestyle change   MEDICATIONS: see list.  Metformin 500 and lo-lestrin OCP for PCOS   DIETARY INTAKE:  Not enough time to access at this visit  Usual physical activity: none  Estimated energy needs: 1800 calories 200 g carbohydrates 135 g protein 50 g fat    Nutritional Diagnosis:  [place]-2.1 Inpaired nutrition utilization As related to carbohydrates.  As evidenced by PCOS.    Intervention: Did not provide Nutrition counseling.  Due to scheduling error, there was not enough time for much intervention.   Did discuss D-chiro-inositol: reduces  circulating insulin, decreases serum androgens, and ameliorates some of the metabolic abnormalities (increased blood pressure and hypertriglyceridemia) of syndrome X and suggested talking with her provider about taking inositiol.   Teaching Method Utilized:  Auditory   Handouts given during visit include:  Pamphlet on inositol   Barriers to learning/adherence to lifestyle change:mental health  Demonstrated degree of understanding via:  Teach Back   Monitoring/Evaluation:  Dietary intake, exercise, lab data, and body weight in 1 month(s).

## 2014-05-09 ENCOUNTER — Ambulatory Visit (INDEPENDENT_AMBULATORY_CARE_PROVIDER_SITE_OTHER): Payer: 59 | Admitting: Family Medicine

## 2014-05-09 ENCOUNTER — Encounter: Payer: Self-pay | Admitting: Family Medicine

## 2014-05-09 VITALS — BP 128/88 | HR 97 | Temp 97.6°F | Ht 66.0 in | Wt 339.0 lb

## 2014-05-09 DIAGNOSIS — J0110 Acute frontal sinusitis, unspecified: Secondary | ICD-10-CM

## 2014-05-09 DIAGNOSIS — J0111 Acute recurrent frontal sinusitis: Secondary | ICD-10-CM

## 2014-05-09 DIAGNOSIS — J329 Chronic sinusitis, unspecified: Secondary | ICD-10-CM | POA: Insufficient documentation

## 2014-05-09 MED ORDER — AMOXICILLIN-POT CLAVULANATE 875-125 MG PO TABS: 1.0000 | ORAL_TABLET | Freq: Two times a day (BID) | ORAL | Status: DC

## 2014-05-09 NOTE — Assessment & Plan Note (Signed)
A: 1 week history of progressively worsening congestion, pain, fatigue. Ddx includes viral URI vs. Acute sinusitis. I do not think her asthma is flared.  P: - Augmentin BID x 7 days - Flonase daily - Given red flags to return for care - F/u prn

## 2014-05-09 NOTE — Patient Instructions (Signed)
Use the antibiotic twice daily for one week. Use the Flonase daily as well. You can keep using mucinex.  If you are not better by Monday, please let me know.  Mead Slane M. Lavana Huckeba, M.D.

## 2014-05-09 NOTE — Progress Notes (Signed)
Patient ID: Amanda PiccoloDeborah L Massey, female   DOB: Feb 21, 1981, 33 y.o.   MRN: [id number]    Subjective: HPI: Patient is a 33 y.o. female presenting to clinic today for same day appointment for congestion.  Sinus Pain Patient complains of congestion, facial pain and sinus pressure. Onset of symptoms was 6 days ago. Symptoms have been gradually worsening since that time. She has had more fatigue and weakness than usual. He has tried mucinex and other OTC medications without relief. She is drinking plenty of fluids.  Past history is significant for asthma and has used her albuterol more than usual. She reports cough since last night, green sputum.  History Reviewed: Patient is non-smoker.  ROS: Please see HPI above.  Objective: Office vital signs reviewed. BP 128/88  Pulse 97  Temp(Src) 97.6 F (36.4 C) (Oral)  Ht 5\' 6"  (1.676 m)  Wt 339 lb (153.769 kg)  BMI 54.74 kg/m2  SpO2 97%  Physical Examination:  General: Awake, alert. NAD HEENT: Atraumatic, normocephalic. MMM. 2+ tonsils with erythema. TM wnl. TTP frontal sinuses Neck: No masses palpated. Mild LAD Pulm: CTAB, no wheezes, good air movement Cardio: RRR, no murmurs appreciated Extremities: No edema Neuro: Grossly intact  Assessment: 33 y.o. female with probable sinusitis  Plan: See Problem List and After Visit Summary

## 2014-05-20 ENCOUNTER — Ambulatory Visit: Payer: 59 | Admitting: *Deleted

## 2014-06-17 ENCOUNTER — Ambulatory Visit: Payer: 59 | Admitting: *Deleted

## 2014-06-18 ENCOUNTER — Encounter (HOSPITAL_BASED_OUTPATIENT_CLINIC_OR_DEPARTMENT_OTHER): Payer: Self-pay | Admitting: Emergency Medicine

## 2014-06-18 DIAGNOSIS — H81399 Other peripheral vertigo, unspecified ear: Secondary | ICD-10-CM | POA: Insufficient documentation

## 2014-06-18 DIAGNOSIS — J45909 Unspecified asthma, uncomplicated: Secondary | ICD-10-CM | POA: Insufficient documentation

## 2014-06-18 DIAGNOSIS — Z792 Long term (current) use of antibiotics: Secondary | ICD-10-CM | POA: Insufficient documentation

## 2014-06-18 DIAGNOSIS — Z79899 Other long term (current) drug therapy: Secondary | ICD-10-CM | POA: Insufficient documentation

## 2014-06-18 DIAGNOSIS — R42 Dizziness and giddiness: Secondary | ICD-10-CM | POA: Insufficient documentation

## 2014-06-18 DIAGNOSIS — I10 Essential (primary) hypertension: Secondary | ICD-10-CM | POA: Insufficient documentation

## 2014-06-18 DIAGNOSIS — F411 Generalized anxiety disorder: Secondary | ICD-10-CM | POA: Insufficient documentation

## 2014-06-18 DIAGNOSIS — E282 Polycystic ovarian syndrome: Secondary | ICD-10-CM | POA: Insufficient documentation

## 2014-06-18 DIAGNOSIS — E669 Obesity, unspecified: Secondary | ICD-10-CM | POA: Insufficient documentation

## 2014-06-18 DIAGNOSIS — IMO0002 Reserved for concepts with insufficient information to code with codable children: Secondary | ICD-10-CM | POA: Insufficient documentation

## 2014-06-18 NOTE — ED Notes (Addendum)
Pt. Reports she started with dizziness 1 week ago with symptoms getting worse.  Pt. Reports she is now completely missing work due to if she moves her eyes she gets dizzy.  Pt. Does report symptoms of vertigo with reports of this time having nausea and diarrhea with vomiting.  Pt. Reports having visual disturbances off and on with headaches.

## 2014-06-19 ENCOUNTER — Emergency Department (HOSPITAL_BASED_OUTPATIENT_CLINIC_OR_DEPARTMENT_OTHER)
Admission: EM | Admit: 2014-06-19 | Discharge: 2014-06-19 | Disposition: A | Discharge status: Home / Self Care | Payer: 59 | Attending: Emergency Medicine | Admitting: Emergency Medicine

## 2014-06-19 DIAGNOSIS — H81399 Other peripheral vertigo, unspecified ear: Secondary | ICD-10-CM

## 2014-06-19 MED ORDER — DIAZEPAM 5 MG PO TABS: 5.0000 mg | ORAL_TABLET | Freq: Four times a day (QID) | ORAL | Status: DC | PRN

## 2014-06-19 MED ORDER — ONDANSETRON 4 MG PO TBDP
4.0000 mg | ORAL_TABLET | Freq: Once | ORAL | Status: AC
  Administered 2014-06-19: 4 mg via ORAL

## 2014-06-19 MED ORDER — DIAZEPAM 5 MG/ML IJ SOLN
5.0000 mg | Freq: Once | INTRAMUSCULAR | Status: AC
  Administered 2014-06-19: 5 mg via INTRAVENOUS
  Filled 2014-06-19: qty 2

## 2014-06-19 MED ORDER — ONDANSETRON 4 MG PO TBDP
ORAL_TABLET | ORAL | Status: AC
  Administered 2014-06-19: 02:00:00 4 mg via ORAL
  Filled 2014-06-19: qty 1

## 2014-06-19 NOTE — Discharge Instructions (Signed)
Epley Maneuver Self-Care WHAT IS THE EPLEY MANEUVER? The Epley maneuver is an exercise you can do to relieve symptoms of benign paroxysmal positional vertigo (BPPV). This condition is often just referred to as vertigo. BPPV is caused by the movement of tiny crystals (canaliths) inside your inner ear. The accumulation and movement of canaliths in your inner ear causes a sudden spinning sensation (vertigo) when you move your head to certain positions. Vertigo usually lasts about 30 seconds. BPPV usually occurs in just one ear. If you get vertigo when you lie on your left side, you probably have BPPV in your left ear. Your health care provider can tell you which ear is involved.  BPPV may be caused by a head injury. Many people older than 50 get BPPV for unknown reasons. If you have been diagnosed with BPPV, your health care provider may teach you how to do this maneuver. BPPV is not life threatening (benign) and usually goes away in time.  WHEN SHOULD I PERFORM THE EPLEY MANEUVER? You can do this maneuver at home whenever you have symptoms of vertigo. You may do the Epley maneuver up to 3 times a day until your symptoms of vertigo go away. HOW SHOULD I DO THE EPLEY MANEUVER? 1. Sit on the edge of a bed or table with your back straight. Your legs should be extended or hanging over the edge of the bed or table.  2. Turn your head halfway toward the affected ear.  3. Lie backward quickly with your head turned until you are lying flat on your back. You may want to position a pillow under your shoulders.  4. Hold this position for 30 seconds. You may experience an attack of vertigo. This is normal. Hold this position until the vertigo stops. 5. Then turn your head to the opposite direction until your unaffected ear is facing the floor.  6. Hold this position for 30 seconds. You may experience an attack of vertigo. This is normal. Hold this position until the vertigo stops. 7. Now turn your whole body to  the same side as your head. Hold for another 30 seconds.  8. You can then sit back up. ARE THERE RISKS TO THIS MANEUVER? In some cases, you may have other symptoms (such as changes in your vision, weakness, or numbness). If you have these symptoms, stop doing the maneuver and call your health care provider. Even if doing these maneuvers relieves your vertigo, you may still have dizziness. Dizziness is the sensation of light-headedness but without the sensation of movement. Even though the Epley maneuver may relieve your vertigo, it is possible that your symptoms will return within 5 years. WHAT SHOULD I DO AFTER THIS MANEUVER? After doing the Epley maneuver, you can return to your normal activities. Ask your doctor if there is anything you should do at home to prevent vertigo. This may include:  Sleeping with two or more pillows to keep your head elevated.  Not sleeping on the side of your affected ear.  Getting up slowly from bed.  Avoiding sudden movements during the day.  Avoiding extreme head movement, like looking up or bending over.  Wearing a cervical collar to prevent sudden head movements. WHAT SHOULD I DO IF MY SYMPTOMS GET WORSE? Call your health care provider if your vertigo gets worse. Call your provider right way if you have other symptoms, including:   Nausea.  Vomiting.  Headache.  Weakness.  Numbness.  Vision changes. Document Released: 11/06/2013 Document Reviewed: 11/06/2013 ExitCare   Patient Information 2015 [employer]. This information is not intended to replace advice given to you by your health care provider. Make sure you discuss any questions you have with your health care provider.  

## 2014-06-19 NOTE — ED Provider Notes (Signed)
CSN: [id number]     Arrival date & time 06/18/14  2314 History   First MD Initiated Contact with Patient 06/19/14 0206     Chief Complaint  Patient presents with  . Dizziness     (Consider location/radiation/quality/duration/timing/severity/associated sxs/prior Treatment) HPI This is a 33 year old female with a history of vertigo. She is here with vertigo symptoms for the past 8 days. Symptoms were mild and intermittent initially but become severe at times with almost constant symptomatology. She describes it as a sense that the surroundings are moving and that she feels off balance. She has to hold onto objects to keep from losing her balance. She has had associated nausea but no vomiting. She has had diarrhea the past 2 days. She's also had an intermittent frontal headache relieved by ibuprofen. She is taking meclizine without relief. The symptoms are severe enough that she's been unable to work. Imprints are worse with movements of the head only few in moving objects and improved with rest and keeping her eyes closed.  Past Medical History  Diagnosis Date  . PCOS (polycystic ovarian syndrome)   . Anxiety   . Asthma   . Vertigo   . Prediabetes   . Obesity   . Hypertension     no rx meds  . Seasonal allergies   . Back pain     tx with OTC meds   Past Surgical History  Procedure Laterality Date  . Wisdom tooth extraction    . Multiple tooth extractions      for braces  . Svd      x 1  . Dilation and evacuation  03/17/2012    Procedure: DILATATION AND EVACUATION;  Surgeon: Jeani Hawking, MD;  Location: [facility];  Service: Gynecology;  Laterality: N/A;   Family History  Problem Relation Age of Onset  . Parkinsonism Father   . Cancer Neg Hx   . Alcohol abuse Neg Hx   . Depression Neg Hx   . Diabetes Neg Hx   . Hypertension Neg Hx   . Hyperlipidemia Neg Hx   . Heart attack Maternal Grandfather    History  Substance Use Topics  . Smoking status: Never Smoker   . Smokeless  tobacco: Never Used  . Alcohol Use: No   OB History   Grav Para Term Preterm Abortions TAB SAB Ect Mult Living   2 1 1  1  1   1      Review of Systems  All other systems reviewed and are negative.   Allergies  Review of patient's allergies indicates no known allergies.  Home Medications   Prior to Admission medications   Medication Sig Start Date End Date Taking? Authorizing Provider  ARIPiprazole (ABILIFY) 10 MG tablet Take 5 mg by mouth daily.   Yes Historical Provider, MD  albuterol (VENTOLIN HFA) 108 (90 BASE) MCG/ACT inhaler Inhale 2 puffs into the lungs every 4 (four) hours as needed for wheezing or shortness of breath. 04/04/14   Janit Pagan, MD  amoxicillin-clavulanate (AUGMENTIN) 875-125 MG per tablet Take 1 tablet by mouth 2 (two) times daily. 05/09/14   Amber Nydia Bouton, MD  b complex vitamins tablet Take 2 tablets by mouth daily.     Historical Provider, MD  busPIRone (BUSPAR) 15 MG tablet Take 30 mg by mouth 3 (three) times daily.     Historical Provider, MD  Celery Seed OIL Take 2 capsules by mouth daily. Takes for BP    Historical Provider, MD  cephALEXin (  KEFLEX) 500 MG capsule Take 1 capsule (500 mg total) by mouth 4 (four) times daily. 08/15/13   Stephanie Couphristopher M Street, MD  cetirizine (ZYRTEC ALLERGY) 10 MG tablet Take 1 tablet (10 mg total) by mouth daily. 10/13/11 10/12/12  Doree AlbeeSteven Newton, MD  Chlorophyll (CHLOROXYGEN PO) Take 1 tablet by mouth 2 (two) times daily.    Historical Provider, MD  chlorpheniramine-HYDROcodone (TUSSIONEX) 10-8 MG/5ML LQCR Take 5 mLs by mouth every 12 (twelve) hours as needed for cough. 12/01/13   Reuben Likesavid C Keller, MD  Cholecalciferol (VITAMIN D3) 5000 UNITS TABS Take 1 tablet by mouth every morning.    Historical Provider, MD  citalopram (CELEXA) 20 MG tablet Take 20 mg by mouth 2 (two) times daily. She is not weaning this med.    Historical Provider, MD  clonazePAM (KLONOPIN) 1 MG tablet Take 1 mg by mouth 3 (three) times daily as needed.      Historical Provider, MD  exenatide (BYETTA) 10 MCG/0.04ML SOLN Inject 20 mcg into the skin daily.     Historical Provider, MD  Fluticasone-Salmeterol (ADVAIR DISKUS) 100-50 MCG/DOSE AEPB Inhale 1 puff into the lungs 2 (two) times daily. 12/21/13   Janit PaganKehinde Eniola, MD  ketotifen (ZADITOR) 0.025 % ophthalmic solution 1 drop 2 (two) times daily.    Historical Provider, MD  meclizine (ANTIVERT) 25 MG tablet Take 1 tablet (25 mg total) by mouth 3 (three) times daily as needed. 10/13/11   Doree AlbeeSteven Newton, MD  metFORMIN (GLUCOPHAGE) 1000 MG tablet Take 500 mg by mouth daily with breakfast.     Historical Provider, MD  norethindrone-ethinyl estradiol-iron (ESTROSTEP FE,TILIA FE,TRI-LEGEST FE) 1-20/1-30/1-35 MG-MCG tablet Take 1 tablet by mouth daily.    Historical Provider, MD  predniSONE (DELTASONE) 20 MG tablet Take 1 tablet (20 mg total) by mouth 2 (two) times daily. 12/01/13   Reuben Likesavid C Keller, MD  Prenatal Vit-Fe Fumarate-FA (PRENATAL PO) Take 1 tablet by mouth daily.    Historical Provider, MD  propranolol (INDERAL) 10 MG tablet Take 1 tablet (10 mg total) by mouth 3 (three) times daily. 06/12/13   Ozella Rocksavid J Merrell, MD   BP 147/68  Pulse 93  Temp(Src) 97.6 F (36.4 C) (Oral)  Resp 20  Ht 5\' 6"  (1.676 m)  Wt 340 lb (154.223 kg)  BMI 54.90 kg/m2  SpO2 100%  LMP 06/11/2014  Physical Exam General: Well-developed, well-nourished female in no acute distress; appearance consistent with age of record HENT: normocephalic; atraumatic; TMs normal Eyes: pupils equal, round and reactive to light; extraocular muscles intact; no nystagmus Neck: supple Heart: regular rate and rhythm Lungs: clear to auscultation bilaterally Abdomen: soft; nondistended Extremities: No deformity; full range of motion; pulses normal Neurologic: Awake, alert and oriented; motor function intact in all extremities and symmetric; no facial droop; normal coordination and speech; subjective vertigo on movement of head Skin: Warm and  dry Psychiatric: Normal mood and affect    ED Course  Procedures (including critical care time)  MDM  3:45 AM Patient states she is "85% better" after IM Valium 5 mg. We will place her on Valium and [facility]. She was advised to discontinue the clonazepam while taking Valium.    Hanley SeamenJohn L Tangee Marszalek, MD 06/19/14 [phone]

## 2014-06-24 ENCOUNTER — Encounter: Payer: Self-pay | Admitting: Family Medicine

## 2014-06-24 ENCOUNTER — Ambulatory Visit (INDEPENDENT_AMBULATORY_CARE_PROVIDER_SITE_OTHER): Payer: 59 | Admitting: Family Medicine

## 2014-06-24 VITALS — Ht 66.0 in | Wt 347.0 lb

## 2014-06-24 DIAGNOSIS — R42 Dizziness and giddiness: Secondary | ICD-10-CM

## 2014-06-24 MED ORDER — PROMETHAZINE HCL 25 MG PO TABS: 25.0000 mg | ORAL_TABLET | Freq: Four times a day (QID) | ORAL | Status: DC | PRN

## 2014-06-24 MED ORDER — MECLIZINE HCL 50 MG PO TABS: 50.0000 mg | ORAL_TABLET | Freq: Three times a day (TID) | ORAL | Status: DC | PRN

## 2014-06-24 MED ORDER — DIAZEPAM 5 MG PO TABS: 5.0000 mg | ORAL_TABLET | Freq: Four times a day (QID) | ORAL | Status: DC | PRN

## 2014-06-24 NOTE — Assessment & Plan Note (Signed)
Marked worsening--? exacerbated by change to Abilify--trial of promethazine--increased dose of meclizine and valium prn--continue exercises to help--if no improvement in 1-2 wks--return to psych for medication adjustment.
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
## 2014-06-24 NOTE — Patient Instructions (Signed)

## 2014-06-25 ENCOUNTER — Other Ambulatory Visit: Payer: Self-pay | Admitting: Obstetrics and Gynecology

## 2014-06-26 LAB — CYTOLOGY - PAP

## 2014-06-27 ENCOUNTER — Encounter: Payer: Self-pay | Admitting: Family Medicine

## 2014-06-27 NOTE — Progress Notes (Signed)
Patient ID: Amanda PiccoloDeborah L Allums, female   DOB: [birth year], 33 y.o.   MRN: [id number] Have patient schedule follow up with me to complete FMLA form.I will not be able to complete it without a face to face encounter. Thank you.

## 2014-07-02 ENCOUNTER — Ambulatory Visit (INDEPENDENT_AMBULATORY_CARE_PROVIDER_SITE_OTHER): Payer: 59 | Admitting: Family Medicine

## 2014-07-02 ENCOUNTER — Encounter: Payer: Self-pay | Admitting: Family Medicine

## 2014-07-02 VITALS — BP 131/88 | HR 130 | Temp 97.9°F | Wt 348.0 lb

## 2014-07-02 DIAGNOSIS — F341 Dysthymic disorder: Secondary | ICD-10-CM | POA: Diagnosis not present

## 2014-07-02 DIAGNOSIS — F419 Anxiety disorder, unspecified: Secondary | ICD-10-CM

## 2014-07-02 DIAGNOSIS — R42 Dizziness and giddiness: Secondary | ICD-10-CM | POA: Diagnosis not present

## 2014-07-02 DIAGNOSIS — R000 Tachycardia, unspecified: Secondary | ICD-10-CM

## 2014-07-02 DIAGNOSIS — H8110 Benign paroxysmal vertigo, unspecified ear: Secondary | ICD-10-CM | POA: Diagnosis not present

## 2014-07-02 DIAGNOSIS — F329 Major depressive disorder, single episode, unspecified: Secondary | ICD-10-CM

## 2014-07-02 NOTE — Progress Notes (Addendum)
Subjective:     Patient ID: Amanda Massey, female   DOB: [birth year], 33 y.o.   MRN: [id number]  Dizziness This is a recurrent problem. Episode onset: Started in 2005 but has been intermittent till July 27th 2015 after returning from a trip to [place]. The problem occurs daily. The problem has been gradually worsening (Cannot stand without the support of her husband.). Associated symptoms include headaches and vertigo. Pertinent negatives include no chest pain, diaphoresis, fever, nausea, numbness, rash or visual change. Exacerbated by: Movement or change in position. Anxiety also makes her symptoms worse. Treatments tried: Meclizine and Valium. The treatment provided moderate relief.  Anxiety Presents for follow-up visit. Symptoms include dizziness, excessive worry, hyperventilation, nervous/anxious behavior, panic and shortness of breath. Patient reports no chest pain, confusion, nausea or suicidal ideas. Primary symptoms comment: tingling of left arm no numbness. Symptoms occur most days. The severity of symptoms is moderate. Nothing aggravates the symptoms.   Her past medical history is significant for anxiety/panic attacks. Improvement on treatment: Abilify,Buspar.   Current Outpatient Prescriptions on File Prior to Visit  Medication Sig Dispense Refill  . albuterol (VENTOLIN HFA) 108 (90 BASE) MCG/ACT inhaler Inhale 2 puffs into the lungs every 4 (four) hours as needed for wheezing or shortness of breath.  1 each  4  . amoxicillin-clavulanate (AUGMENTIN) 875-125 MG per tablet Take 1 tablet by mouth 2 (two) times daily.  14 tablet  0  . ARIPiprazole (ABILIFY) 10 MG tablet Take 5 mg by mouth daily.      Marland Kitchen b complex vitamins tablet Take 2 tablets by mouth daily.       . busPIRone (BUSPAR) 15 MG tablet Take 30 mg by mouth 3 (three) times daily.       Dewayne Shorter Seed OIL Take 2 capsules by mouth daily. Takes for BP      . cephALEXin (KEFLEX) 500 MG capsule Take 1 capsule (500 mg total) by mouth 4  (four) times daily.  20 capsule  0  . cetirizine (ZYRTEC ALLERGY) 10 MG tablet Take 1 tablet (10 mg total) by mouth daily.  30 tablet  6  . Chlorophyll (CHLOROXYGEN PO) Take 1 tablet by mouth 2 (two) times daily.      . chlorpheniramine-HYDROcodone (TUSSIONEX) 10-8 MG/5ML LQCR Take 5 mLs by mouth every 12 (twelve) hours as needed for cough.  140 mL  0  . Cholecalciferol (VITAMIN D3) 5000 UNITS TABS Take 1 tablet by mouth every morning.      . citalopram (CELEXA) 20 MG tablet Take 20 mg by mouth 2 (two) times daily. She is not weaning this med.      . clonazePAM (KLONOPIN) 1 MG tablet Take 1 mg by mouth 3 (three) times daily as needed.       . diazepam (VALIUM) 5 MG tablet Take 1 tablet (5 mg total) by mouth every 6 (six) hours as needed (for vertigo).  30 tablet  1  . exenatide (BYETTA) 10 MCG/0.04ML SOLN Inject 20 mcg into the skin daily.       . Fluticasone-Salmeterol (ADVAIR DISKUS) 100-50 MCG/DOSE AEPB Inhale 1 puff into the lungs 2 (two) times daily.  60 each  5  . ketotifen (ZADITOR) 0.025 % ophthalmic solution 1 drop 2 (two) times daily.      . meclizine (ANTIVERT) 50 MG tablet Take 1 tablet (50 mg total) by mouth 3 (three) times daily as needed.  30 tablet  2  . metFORMIN (GLUCOPHAGE) 1000 MG tablet Take 500  mg by mouth daily with breakfast.       . norethindrone-ethinyl estradiol-iron (ESTROSTEP FE,TILIA FE,TRI-LEGEST FE) 1-20/1-30/1-35 MG-MCG tablet Take 1 tablet by mouth daily.      . predniSONE (DELTASONE) 20 MG tablet Take 1 tablet (20 mg total) by mouth 2 (two) times daily.  10 tablet  0  . Prenatal Vit-Fe Fumarate-FA (PRENATAL PO) Take 1 tablet by mouth daily.      . promethazine (PHENERGAN) 25 MG tablet Take 1 tablet (25 mg total) by mouth every 6 (six) hours as needed for nausea.  42 tablet  2  . propranolol (INDERAL) 10 MG tablet Take 1 tablet (10 mg total) by mouth 3 (three) times daily.  90 tablet  3   No current facility-administered medications on file prior to visit.    Past Medical History  Diagnosis Date  . PCOS (polycystic ovarian syndrome)   . Anxiety   . Asthma   . Vertigo   . Prediabetes   . Obesity   . Hypertension     no rx meds  . Seasonal allergies   . Back pain     tx with OTC meds     Review of Systems  Constitutional: Negative for fever and diaphoresis.  Respiratory: Positive for shortness of breath.   Cardiovascular: Negative.  Negative for chest pain.  Gastrointestinal: Negative.  Negative for nausea.  Skin: Negative for rash.  Neurological: Positive for dizziness, vertigo and headaches. Negative for tremors, seizures, syncope and numbness.       Shaky at times due to nervousness.  Psychiatric/Behavioral: Negative for suicidal ideas and confusion. The patient is nervous/anxious.   All other systems reviewed and are negative.  Filed Vitals:   07/02/14 1033  BP: 131/88  Pulse: 130  Temp: 97.9 F (36.6 C)  TempSrc: Oral  Weight: 348 lb (157.852 kg)       Objective:   Physical Exam  Constitutional: She is oriented to person, place, and time.  Walking with husbands support due to fear of fall.  Eyes: Pupils are equal, round, and reactive to light.  Neck: Neck supple.  Cardiovascular: Normal rate, regular rhythm, normal heart sounds and intact distal pulses.   No murmur heard. Pulmonary/Chest: Effort normal and breath sounds normal. No respiratory distress. She has no wheezes.  Abdominal: Soft. Bowel sounds are normal. She exhibits no distension and no mass. There is no tenderness.  Musculoskeletal: Normal range of motion. She exhibits no edema.  Neurological: She is alert and oriented to person, place, and time. No cranial nerve deficit. Coordination normal.  Psychiatric:  Anxious, teary.       Assessment:     Vertigo Anxiety  Tachycardia    Plan:     Check problem list Total time for this face to face encounter and coordination of care was more than 30 min.

## 2014-07-02 NOTE — Addendum Note (Signed)
Addended by: Janit PaganENIOLA, Donnesha Karg T on: 07/02/2014 02:00 PM   Modules accepted: Orders

## 2014-07-02 NOTE — Assessment & Plan Note (Addendum)
Incapacitating. Had not been able to return to work. Continue Meclizine prn. I recommended vestibular rehab. Referral done today. I completed her FMLA form as well. Instruction given to return for BMET,CBC and TSH to r/o other causes of dizziness.

## 2014-07-02 NOTE — Assessment & Plan Note (Addendum)
She has close follow up with Psychiatrist. Not currently suicidal. I recommended Psychology counseling or psychotherapy in addition to medication. Instruction given to call and schedule appointment with Spero GeraldsMichelle Kane.  NB: I called patient later and gave her Dr Carola RhineKane's phone number to schedule appointment.

## 2014-07-02 NOTE — Patient Instructions (Signed)
Vertigo Vertigo means you feel like you are moving when you are not. Vertigo can make you feel like things around you are moving when they are not. This problem often goes away on its own.  HOME CARE   Follow your doctor's instructions.  Avoid driving.  Avoid using heavy machinery.  Avoid doing any activity that could be dangerous if you have a vertigo attack.  Tell your doctor if a medicine seems to cause your vertigo. GET HELP RIGHT AWAY IF:   Your medicines do not help or make you feel worse.  You have trouble talking or walking.  You feel weak or have trouble using your arms, hands, or legs.  You have bad headaches.  You keep feeling sick to your stomach (nauseous) or throwing up (vomiting).  Your vision changes.  A family member notices changes in your behavior.  Your problems get worse. MAKE SURE YOU:  Understand these instructions.  Will watch your condition.  Will get help right away if you are not doing well or get worse. Document Released: 08/10/2008 Document Revised: 01/24/2012 Document Reviewed: 05/20/2011 ExitCare Patient Information 2015 [employer]. This information is not intended to replace advice given to you by your health care provider. Make sure you discuss any questions you have with your health care provider.  

## 2014-07-04 ENCOUNTER — Other Ambulatory Visit: Payer: 59

## 2014-07-04 DIAGNOSIS — R42 Dizziness and giddiness: Secondary | ICD-10-CM

## 2014-07-04 NOTE — Progress Notes (Signed)
BMP,CBC AND TSH DONE TODAY Amanda Massey 

## 2014-07-05 ENCOUNTER — Telehealth: Payer: Self-pay | Admitting: Family Medicine

## 2014-07-05 DIAGNOSIS — R000 Tachycardia, unspecified: Secondary | ICD-10-CM | POA: Insufficient documentation

## 2014-07-05 LAB — CBC
HCT: 36.1 % (ref 36.0–46.0)
Hemoglobin: 12.4 g/dL (ref 12.0–15.0)
MCH: 29.4 pg (ref 26.0–34.0)
MCHC: 34.3 g/dL (ref 30.0–36.0)
MCV: 85.5 fL (ref 78.0–100.0)
Platelets: 415 10*3/uL — ABNORMAL HIGH (ref 150–400)
RBC: 4.22 MIL/uL (ref 3.87–5.11)
RDW: 14.6 % (ref 11.5–15.5)
WBC: 9.1 10*3/uL (ref 4.0–10.5)

## 2014-07-05 LAB — BASIC METABOLIC PANEL
BUN: 10 mg/dL (ref 6–23)
CALCIUM: 9.2 mg/dL (ref 8.4–10.5)
CO2: 27 mEq/L (ref 19–32)
CREATININE: 0.76 mg/dL (ref 0.50–1.10)
Chloride: 103 mEq/L (ref 96–112)
Glucose, Bld: 118 mg/dL — ABNORMAL HIGH (ref 70–99)
Potassium: 4.3 mEq/L (ref 3.5–5.3)
SODIUM: 140 meq/L (ref 135–145)

## 2014-07-05 LAB — TSH: TSH: 4.161 u[IU]/mL (ref 0.350–4.500)

## 2014-07-05 NOTE — Telephone Encounter (Signed)
I called to discuss test result, it looks ok except for slightly elevated glucose, she had normal A1C checked in June last yr. I also revisited her increased HR, no cardiac symptoms but I recommended reassessment in about 1 wk. She will schedule follow up with me. She mentioned she had another vertigo attack today which improved with meclizine. She is otherwise doing ok.

## 2014-07-05 NOTE — Assessment & Plan Note (Signed)
Patient is significantly anxious during this visit, this could be contributing to her increased HR. Also on Abilify which can cause tachycardia. Currently with no other cardiac signs or symptoms. TSH,CBC, Bmet ordered. EKG done 2 wks ago showed sinus tachycardia. Continue valium prn. RTC in 1-2 wks for reassessment or sooner if symptomatic.

## 2014-07-12 ENCOUNTER — Ambulatory Visit: Payer: 59 | Admitting: Family Medicine

## 2014-07-19 ENCOUNTER — Encounter: Payer: Self-pay | Admitting: Family Medicine

## 2014-07-19 ENCOUNTER — Ambulatory Visit (INDEPENDENT_AMBULATORY_CARE_PROVIDER_SITE_OTHER): Payer: 59 | Admitting: Family Medicine

## 2014-07-19 ENCOUNTER — Encounter: Payer: 59 | Admitting: Family Medicine

## 2014-07-19 VITALS — BP 137/82 | HR 104 | Temp 97.8°F | Wt 349.0 lb

## 2014-07-19 DIAGNOSIS — R42 Dizziness and giddiness: Secondary | ICD-10-CM

## 2014-07-19 DIAGNOSIS — F341 Dysthymic disorder: Secondary | ICD-10-CM

## 2014-07-19 DIAGNOSIS — R002 Palpitations: Secondary | ICD-10-CM

## 2014-07-19 DIAGNOSIS — R000 Tachycardia, unspecified: Secondary | ICD-10-CM

## 2014-07-19 DIAGNOSIS — F419 Anxiety disorder, unspecified: Secondary | ICD-10-CM

## 2014-07-19 DIAGNOSIS — F329 Major depressive disorder, single episode, unspecified: Secondary | ICD-10-CM

## 2014-07-19 NOTE — Progress Notes (Signed)
Subjective:     Patient ID: Amanda Massey, female   DOB: [birth year], 33 y.o.   MRN: [id number]  HPI Tachycardia:here for follow up, denies chest pain,currently no palpitation but she does have episodes of palpitations on and off almost daily. She denies any use of ilicit drugs, does not drink a lot of coffee, usually take decaf. Vertigo:Symptoms improving gradually but still not back to baseline. Meclizine prn helps a lot. Anxiety/Depression: She is getting better, denies any new concern, has follow up with psych in 2 wks.  Current Outpatient Prescriptions on File Prior to Visit  Medication Sig Dispense Refill  . albuterol (VENTOLIN HFA) 108 (90 BASE) MCG/ACT inhaler Inhale 2 puffs into the lungs every 4 (four) hours as needed for wheezing or shortness of breath.  1 each  4  . amoxicillin-clavulanate (AUGMENTIN) 875-125 MG per tablet Take 1 tablet by mouth 2 (two) times daily.  14 tablet  0  . ARIPiprazole (ABILIFY) 10 MG tablet Take 5 mg by mouth daily.      Marland Kitchen b complex vitamins tablet Take 2 tablets by mouth daily.       . busPIRone (BUSPAR) 15 MG tablet Take 30 mg by mouth 3 (three) times daily.       Dewayne Shorter Seed OIL Take 2 capsules by mouth daily. Takes for BP      . cephALEXin (KEFLEX) 500 MG capsule Take 1 capsule (500 mg total) by mouth 4 (four) times daily.  20 capsule  0  . cetirizine (ZYRTEC ALLERGY) 10 MG tablet Take 1 tablet (10 mg total) by mouth daily.  30 tablet  6  . Chlorophyll (CHLOROXYGEN PO) Take 1 tablet by mouth 2 (two) times daily.      . chlorpheniramine-HYDROcodone (TUSSIONEX) 10-8 MG/5ML LQCR Take 5 mLs by mouth every 12 (twelve) hours as needed for cough.  140 mL  0  . Cholecalciferol (VITAMIN D3) 5000 UNITS TABS Take 1 tablet by mouth every morning.      . citalopram (CELEXA) 20 MG tablet Take 20 mg by mouth 2 (two) times daily. She is not weaning this med.      . clonazePAM (KLONOPIN) 1 MG tablet Take 1 mg by mouth 3 (three) times daily as needed.       .  diazepam (VALIUM) 5 MG tablet Take 1 tablet (5 mg total) by mouth every 6 (six) hours as needed (for vertigo).  30 tablet  1  . exenatide (BYETTA) 10 MCG/0.04ML SOLN Inject 20 mcg into the skin daily.       . Fluticasone-Salmeterol (ADVAIR DISKUS) 100-50 MCG/DOSE AEPB Inhale 1 puff into the lungs 2 (two) times daily.  60 each  5  . ketotifen (ZADITOR) 0.025 % ophthalmic solution 1 drop 2 (two) times daily.      . meclizine (ANTIVERT) 50 MG tablet Take 1 tablet (50 mg total) by mouth 3 (three) times daily as needed.  30 tablet  2  . metFORMIN (GLUCOPHAGE) 1000 MG tablet Take 500 mg by mouth daily with breakfast.       . norethindrone-ethinyl estradiol-iron (ESTROSTEP FE,TILIA FE,TRI-LEGEST FE) 1-20/1-30/1-35 MG-MCG tablet Take 1 tablet by mouth daily.      . predniSONE (DELTASONE) 20 MG tablet Take 1 tablet (20 mg total) by mouth 2 (two) times daily.  10 tablet  0  . Prenatal Vit-Fe Fumarate-FA (PRENATAL PO) Take 1 tablet by mouth daily.      . promethazine (PHENERGAN) 25 MG tablet Take 1 tablet (25 mg  total) by mouth every 6 (six) hours as needed for nausea.  42 tablet  2  . propranolol (INDERAL) 10 MG tablet Take 1 tablet (10 mg total) by mouth 3 (three) times daily.  90 tablet  3   No current facility-administered medications on file prior to visit.   Past Medical History  Diagnosis Date  . PCOS (polycystic ovarian syndrome)   . Anxiety   . Asthma   . Vertigo   . Prediabetes   . Obesity   . Hypertension     no rx meds  . Seasonal allergies   . Back pain     tx with OTC meds      Review of Systems  Respiratory: Negative.   Cardiovascular: Negative.   Gastrointestinal: Negative.   Genitourinary: Negative.   Neurological:       Vertigo  All other systems reviewed and are negative.      Filed Vitals:   07/19/14 1427  BP: 137/82  Pulse: 104  Temp: 97.8 F (36.6 C)  TempSrc: Oral  Weight: 349 lb (158.305 kg)    Objective:   Physical Exam  Nursing note and vitals  reviewed. Constitutional: She is oriented to person, place, and time. She appears well-developed. No distress.  Cardiovascular: Normal rate, regular rhythm, normal heart sounds and intact distal pulses.   No murmur heard. Pulmonary/Chest: Effort normal and breath sounds normal. No respiratory distress. She has no wheezes.  Abdominal: Soft. Bowel sounds are normal. She exhibits no distension and no mass. There is no tenderness.  Musculoskeletal: Normal range of motion. She exhibits no edema.  Neurological: She is alert and oriented to person, place, and time.  Psychiatric: She has a normal mood and affect. Her behavior is normal. Judgment and thought content normal. She expresses no homicidal and no suicidal ideation. She expresses no suicidal plans and no homicidal plans.       Assessment:     Tachycardia Vertigo Depression/Anxiety     Plan:     Check problem list.

## 2014-07-19 NOTE — Assessment & Plan Note (Signed)
Currently asymptomatic. This is not a new issue for her. EKG reviewed showed sinus tachy. HR today was 114, repeat after few min was 104. TSH, Hgb normal. Holter monitor order to assess her. Will consider betablocker likely when she is stable with her vertigo. Return precautions given.

## 2014-07-19 NOTE — Assessment & Plan Note (Signed)
Still not back at baseline but improving. I again recommended vestibular exercise, I referred to PT today. Continue Meclizine prn.

## 2014-07-19 NOTE — Patient Instructions (Signed)
Palpitations  A palpitation is the feeling that your heartbeat is irregular. It may feel like your heart is fluttering or skipping a beat. It may also feel like your heart is beating faster than normal. This is usually not a serious problem. In some cases, you may need more medical tests.  HOME CARE  · Avoid:  ¨ Caffeine in coffee, tea, soft drinks, diet pills, and energy drinks.  ¨ Chocolate.  ¨ Alcohol.  · Stop smoking if you smoke.  · Reduce your stress and anxiety. Try:  ¨ A method that measures bodily functions so you can learn to control them (biofeedback).  ¨ Yoga.  ¨ Meditation.  ¨ Physical activity such as swimming, jogging, or walking.  · Get plenty of rest and sleep.  GET HELP IF:  · Your fast or irregular heartbeat continues after 24 hours.  · Your palpitations occur more often.  GET HELP RIGHT AWAY IF:   · You have chest pain.  · You feel short of breath.  · You have a very bad headache.  · You feel dizzy or pass out (faint).  MAKE SURE YOU:   · Understand these instructions.  · Will watch your condition.  · Will get help right away if you are not doing well or get worse.  Document Released: 08/10/2008 Document Revised: 03/18/2014 Document Reviewed: 12/31/2011  ExitCare® Patient Information ©2015 [employer]. This information is not intended to replace advice given to you by your health care provider. Make sure you discuss any questions you have with your health care provider.

## 2014-07-19 NOTE — Assessment & Plan Note (Signed)
Stable on current regimen although not totally at baseline. Not suicidal or homicidal. F/U with psychiatrist as scheduled.

## 2014-07-23 ENCOUNTER — Encounter (INDEPENDENT_AMBULATORY_CARE_PROVIDER_SITE_OTHER): Payer: 59

## 2014-07-23 ENCOUNTER — Telehealth: Payer: Self-pay | Admitting: Family Medicine

## 2014-07-23 ENCOUNTER — Encounter: Payer: Self-pay | Admitting: *Deleted

## 2014-07-23 ENCOUNTER — Ambulatory Visit: Payer: 59 | Admitting: *Deleted

## 2014-07-23 DIAGNOSIS — I498 Other specified cardiac arrhythmias: Secondary | ICD-10-CM

## 2014-07-23 DIAGNOSIS — R002 Palpitations: Secondary | ICD-10-CM

## 2014-07-23 DIAGNOSIS — R000 Tachycardia, unspecified: Secondary | ICD-10-CM

## 2014-07-23 NOTE — Progress Notes (Signed)
Patient ID: Amanda Massey, female   DOB: 04/21/1981, 33 y.o.   MRN: [id number] [facility] 48 hour holter monitor applied to patient.

## 2014-07-23 NOTE — Telephone Encounter (Signed)
Pt called and would like Dr. Phebe Colla assistant Shanda Bumps to call her concerning some information that needs to be included in the fax. jw

## 2014-07-23 NOTE — Telephone Encounter (Signed)
Spoke with pt, she would like her OVN from 07/19/14 faxed to her short term disability.  Faxed as requested to [employer] Ide [phone]) Claim number: [id number] Fax # [phone]  Madison Direnzo, [name]

## 2014-07-30 ENCOUNTER — Telehealth: Payer: Self-pay | Admitting: Family Medicine

## 2014-07-30 DIAGNOSIS — I493 Ventricular premature depolarization: Secondary | ICD-10-CM

## 2014-07-30 DIAGNOSIS — R002 Palpitations: Secondary | ICD-10-CM

## 2014-07-30 NOTE — Telephone Encounter (Signed)
Appt made for 08-16-14 for both echo and cards.  Pt is aware of this and reminder cards mailed. Jazmin Hartsell,CMA

## 2014-07-30 NOTE — Telephone Encounter (Signed)
I received patient's ECHO report which I discussed with the cardiologist Dr Delton See. She recommended that since patient is symptomatic with PVC of 1501 to be seen by them. I will also order an ECHO to be done prior to seeing cardiologist. I discussed result and plan with patient. She verbalized understanding. She feels okay today with no concern.

## 2014-08-07 ENCOUNTER — Telehealth: Payer: Self-pay | Admitting: *Deleted

## 2014-08-07 NOTE — Telephone Encounter (Signed)
LMOVM for pt to return call.  Please advise of and schedule one. Amanda Massey, [name]

## 2014-08-07 NOTE — Telephone Encounter (Signed)
Message copied by Osborne Oman on Wed Aug 07, 2014  1:53 PM ------      Message from: Janit Pagan T      Created: Wed Aug 07, 2014 12:49 PM       Am already on vacation, need to come in when am available or see someone else.      ----- Message -----         From: Osborne Oman, CMA         Sent: 08/07/2014  11:51 AM           To: Janit Pagan, MD            You do not have any availability until 09/03/2014.  Are you willing to see her a day that you are not in clinic?  I know you are going on vacation soon???? Zebastian Carico, [name]            ----- Message -----         From: Janit Pagan, MD         Sent: 08/06/2014   4:37 PM           To: [facility]            I got a disability form for patient, there are some questions on the form I need to review with her, please have her come in to see me to complete paper work.             ------

## 2014-08-08 ENCOUNTER — Telehealth: Payer: Self-pay | Admitting: Family Medicine

## 2014-08-08 NOTE — Telephone Encounter (Signed)
Pt calls, would like to know if Dr. Lum Babe would manage her mental health med until her appt with her new psychiatrist, Dr. Evelene Croon on 01/23/15 at 12:45pm. She has enough meds to last her through October. Pls advise.

## 2014-08-08 NOTE — Telephone Encounter (Signed)
Who was managing her before and what happened with her previous Psychiatrist, it is the responsibility of her previous psychiatrist to ensure she receives adequate healthcare till she is able to establish care with her new psychaitrist. She has complicated Psych issue which might be difficult for me to manage.

## 2014-08-09 ENCOUNTER — Telehealth: Payer: Self-pay | Admitting: Psychology

## 2014-08-09 NOTE — Telephone Encounter (Signed)
LMOVM for pt to return call .Fleeger, Jessica Dawn  

## 2014-08-09 NOTE — Telephone Encounter (Signed)
Patient called and left a VM requesting an appointment.  I reviewed the chart and noted that it looks like she is trying to get mental health coverage until she can be seen by Dr. Evelene Croon in 01/2015.  I called the patient back twice.  Both times the phone rang several times and then went to a fast busy signal.  Will try again later.

## 2014-08-10 ENCOUNTER — Emergency Department: Payer: Self-pay | Admitting: Emergency Medicine

## 2014-08-10 LAB — URINALYSIS, COMPLETE
Bacteria: NONE SEEN
Bilirubin,UR: NEGATIVE
Blood: NEGATIVE
Glucose,UR: NEGATIVE mg/dL (ref 0–75)
Ketone: NEGATIVE
Nitrite: NEGATIVE
PH: 6 (ref 4.5–8.0)
Protein: NEGATIVE
RBC,UR: 1 /HPF (ref 0–5)
SPECIFIC GRAVITY: 1.002 (ref 1.003–1.030)
Squamous Epithelial: 5

## 2014-08-10 LAB — CBC
HCT: 41.1 % (ref 35.0–47.0)
HGB: 13.5 g/dL (ref 12.0–16.0)
MCH: 29.2 pg (ref 26.0–34.0)
MCHC: 32.9 g/dL (ref 32.0–36.0)
MCV: 89 fL (ref 80–100)
Platelet: 449 10*3/uL — ABNORMAL HIGH (ref 150–440)
RBC: 4.64 10*6/uL (ref 3.80–5.20)
RDW: 13.4 % (ref 11.5–14.5)
WBC: 12.5 10*3/uL — ABNORMAL HIGH (ref 3.6–11.0)

## 2014-08-10 LAB — BASIC METABOLIC PANEL
Anion Gap: 7 (ref 7–16)
BUN: 6 mg/dL — AB (ref 7–18)
CALCIUM: 9.1 mg/dL (ref 8.5–10.1)
CHLORIDE: 104 mmol/L (ref 98–107)
Co2: 29 mmol/L (ref 21–32)
Creatinine: 0.84 mg/dL (ref 0.60–1.30)
EGFR (African American): >60
EGFR (Non-African Amer.): >60
GLUCOSE: 117 mg/dL — AB (ref 65–99)
Osmolality: 278 (ref 275–301)
POTASSIUM: 3.4 mmol/L — AB (ref 3.5–5.1)
SODIUM: 140 mmol/L (ref 136–145)

## 2014-08-10 LAB — TROPONIN I: Troponin-I: <0.02 ng/mL

## 2014-08-13 ENCOUNTER — Telehealth: Payer: Self-pay | Admitting: *Deleted

## 2014-08-13 NOTE — Telephone Encounter (Signed)
Checking status of disability paper faxed on 9/22, would like a phone call today just to see if MD actually received it.

## 2014-08-14 NOTE — Telephone Encounter (Signed)
Patient and I connected to discuss needs.  She had an appointment scheduled with Dr. Evelene CroonKaur for 01/2015 but was recenlty able to secure one with Dr. Donell BeersPlovsky for 09/12/2014.  [facility] so she will stick with Dr. Donell BeersPlovsky.  She states she has enough medication but her depressive and anxiety symptoms are not well controlled on her current regimen.  She asked about therapy - specifically for her Panic Disorder.  She sought therapy for it several years ago and found it mildly beneficial.  Therapy is recommended for panic disorder and I encouraged her to try again to see if she could learn some new coping skills from a different therapist.  [employer] Assistance Program Ebbie Latus(Frank Horton) is an option and one that I recommend.  She was aware of the program and said she would look into it further.  Will forward to Dr. Lum BabeEniola for her information.

## 2014-08-14 NOTE — Telephone Encounter (Signed)
LMOVM for pt to return call.  Please see phone note from 08/07/14.  MD did receive paperwork but wants to see pt in an appt to discuss. Teylor Wolven, Maryjo RochesterJessica Dawn

## 2014-08-14 NOTE — Telephone Encounter (Signed)
Left message for Amanda ChuJana Hall ([employer]) who sent forms for disability, informed her that patient has upcoming appointment for re-evaluation with Dr. Jaquita RectorMelancon, will place paperwork in Dr. Hassell HalimMelancon's box to complete at time of visit.

## 2014-08-14 NOTE — Telephone Encounter (Signed)
Pt to see Dr. Jaquita RectorMelancon on 10/2 at 2 pm for completion of forms.

## 2014-08-15 ENCOUNTER — Other Ambulatory Visit (HOSPITAL_COMMUNITY): Payer: Self-pay | Admitting: Family Medicine

## 2014-08-15 DIAGNOSIS — R002 Palpitations: Secondary | ICD-10-CM

## 2014-08-15 DIAGNOSIS — I493 Ventricular premature depolarization: Secondary | ICD-10-CM

## 2014-08-16 ENCOUNTER — Ambulatory Visit (HOSPITAL_COMMUNITY): Payer: 59 | Attending: Cardiology | Admitting: Cardiology

## 2014-08-16 ENCOUNTER — Encounter: Payer: Self-pay | Admitting: Cardiology

## 2014-08-16 ENCOUNTER — Telehealth: Payer: Self-pay | Admitting: Family Medicine

## 2014-08-16 ENCOUNTER — Ambulatory Visit (INDEPENDENT_AMBULATORY_CARE_PROVIDER_SITE_OTHER): Payer: 59 | Admitting: Cardiology

## 2014-08-16 ENCOUNTER — Ambulatory Visit (INDEPENDENT_AMBULATORY_CARE_PROVIDER_SITE_OTHER): Payer: 59 | Admitting: Family Medicine

## 2014-08-16 ENCOUNTER — Encounter: Payer: Self-pay | Admitting: Family Medicine

## 2014-08-16 VITALS — BP 142/88 | HR 93 | Ht 66.0 in | Wt 339.0 lb

## 2014-08-16 VITALS — BP 179/102 | HR 115 | Wt 339.0 lb

## 2014-08-16 DIAGNOSIS — I517 Cardiomegaly: Secondary | ICD-10-CM | POA: Diagnosis not present

## 2014-08-16 DIAGNOSIS — R42 Dizziness and giddiness: Secondary | ICD-10-CM

## 2014-08-16 DIAGNOSIS — R7303 Prediabetes: Secondary | ICD-10-CM

## 2014-08-16 DIAGNOSIS — R030 Elevated blood-pressure reading, without diagnosis of hypertension: Secondary | ICD-10-CM

## 2014-08-16 DIAGNOSIS — R7309 Other abnormal glucose: Secondary | ICD-10-CM

## 2014-08-16 DIAGNOSIS — R002 Palpitations: Secondary | ICD-10-CM

## 2014-08-16 DIAGNOSIS — R079 Chest pain, unspecified: Secondary | ICD-10-CM

## 2014-08-16 DIAGNOSIS — H8110 Benign paroxysmal vertigo, unspecified ear: Secondary | ICD-10-CM

## 2014-08-16 DIAGNOSIS — I493 Ventricular premature depolarization: Secondary | ICD-10-CM | POA: Diagnosis present

## 2014-08-16 DIAGNOSIS — R0602 Shortness of breath: Secondary | ICD-10-CM

## 2014-08-16 DIAGNOSIS — R000 Tachycardia, unspecified: Secondary | ICD-10-CM

## 2014-08-16 LAB — TSH: TSH: 2.71 u[IU]/mL (ref 0.35–4.50)

## 2014-08-16 LAB — T4, FREE: Free T4: 1.03 ng/dL (ref 0.60–1.60)

## 2014-08-16 LAB — T3, FREE: T3, Free: 2.7 pg/mL (ref 2.3–4.2)

## 2014-08-16 MED ORDER — CARVEDILOL 6.25 MG PO TABS: 6.2500 mg | ORAL_TABLET | Freq: Two times a day (BID) | ORAL | Status: DC

## 2014-08-16 NOTE — Progress Notes (Signed)
Echo performed. 

## 2014-08-16 NOTE — Patient Instructions (Signed)
Thanks for coming in today.   We will fax the paperwork to the sending party.   Call if you need anything else. Dr. Lum BabeEniola should be back soon.   Thanks,   Yolande Jollyaleb G Louretta Tantillo, MD Family Medicine - PGY 1

## 2014-08-16 NOTE — Telephone Encounter (Signed)
MD placed up front to be faxed(which has already been done).  Pt informed.     Pt also request that [facility] [phone].  Which was also done. Dannilynn Gallina, Maryjo RochesterJessica Dawn

## 2014-08-16 NOTE — Telephone Encounter (Signed)
Pt called and would like Shanda BumpsJessica to call her when she faxes the disability papers. jw

## 2014-08-16 NOTE — Patient Instructions (Signed)
Your physician has recommended you make the following change in your medication:   START TAKING CARVEDILOL 6.25 MG TWICE DAILY    Your physician recommends that you return for lab work in: TODAY- (TSH, T3 FREE, AND T4 FREE)   Your physician recommends that you schedule a follow-up appointment in: IN 3 WEEKS WITH DR Delton SeeNELSON

## 2014-08-16 NOTE — Assessment & Plan Note (Addendum)
Previously diagnosed with vertigo - Pt. Continues to complain of ongoing symptoms.  - Follow up with PCP - May need referral for specialized evaluation if symptoms continue. - MLA paperwork filled out and signed at this visit. If patient requires another extension due to symptoms. Would definitely get evaluation by a specialist.

## 2014-08-16 NOTE — Progress Notes (Signed)
Patient ID: Amanda Massey, female   DOB: Sep 08, 1981, 33 y.o.   MRN: [id number]    Patient Name: Amanda Massey Date of Encounter: 08/16/2014  Primary Care Provider:  Janit Pagan, MD Primary Cardiologist:  Lars Masson  Problem List   Past Medical History  Diagnosis Date  . PCOS (polycystic ovarian syndrome)   . Anxiety   . Asthma   . Vertigo   . Prediabetes   . Obesity   . Hypertension     no rx meds  . Seasonal allergies   . Back pain     tx with OTC meds   Past Surgical History  Procedure Laterality Date  . Wisdom tooth extraction    . Multiple tooth extractions      for braces  . Svd      x 1  . Dilation and evacuation  03/17/2012    Procedure: DILATATION AND EVACUATION;  Surgeon: Jeani Hawking, MD;  Location: [facility];  Service: Gynecology;  Laterality: N/A;   Allergies  No Known Allergies  HPI  A very anxious appearing female coming for evaluation of dizziness and palpitations. She has been followed by her PCP for dizziness and started on Meclizine with significant improvement of symptoms. No syncope.  She underwent 48 hour Holter monitoring that showed sinus brady to tachycardia and frequent PVCs (1500 in 24 hours). She denies any chest pain, has some SOB on exertion. She doesn't exercise.  No LE edema. No orthopnea, PND. She has never smoked. No FH of prematuer CAD. She mentions multiple times during conversation that she is anxious and so is her husband.    Home Medications  Prior to Admission medications   Medication Sig Start Date End Date Taking? Authorizing Provider  albuterol (VENTOLIN HFA) 108 (90 BASE) MCG/ACT inhaler Inhale 2 puffs into the lungs every 4 (four) hours as needed for wheezing or shortness of breath. 04/04/14  Yes Janit Pagan, MD  b complex vitamins tablet Take 2 tablets by mouth daily.    Yes Historical Provider, MD  busPIRone (BUSPAR) 30 MG tablet Take 30 mg by mouth 2 (two) times daily.   Yes Historical Provider,  MD  Celery Seed OIL Take 2 capsules by mouth daily. Takes for BP   Yes Historical Provider, MD  Cholecalciferol (VITAMIN D3) 5000 UNITS TABS Take 1 tablet by mouth every morning.   Yes Historical Provider, MD  clonazePAM (KLONOPIN) 1 MG tablet Take 1 mg by mouth 3 (three) times daily.    Yes Historical Provider, MD  diazepam (VALIUM) 5 MG tablet Take 1 tablet (5 mg total) by mouth every 6 (six) hours as needed (for vertigo). 06/24/14  Yes Reva Bores, MD  Fluticasone-Salmeterol (ADVAIR) 100-50 MCG/DOSE AEPB Inhale 1 puff into the lungs daily as needed. 12/21/13  Yes Janit Pagan, MD  ketotifen (ZADITOR) 0.025 % ophthalmic solution 1 drop 2 (two) times daily.   Yes Historical Provider, MD  meclizine (ANTIVERT) 50 MG tablet Take 1 tablet (50 mg total) by mouth 3 (three) times daily as needed. 06/24/14  Yes Reva Bores, MD  metFORMIN (GLUCOPHAGE) 500 MG tablet Take 500 mg by mouth daily with breakfast.   Yes Historical Provider, MD  norethindrone-ethinyl estradiol-iron (ESTROSTEP FE,TILIA FE,TRI-LEGEST FE) 1-20/1-30/1-35 MG-MCG tablet Take 1 tablet by mouth daily.   Yes Historical Provider, MD  promethazine (PHENERGAN) 25 MG tablet Take 1 tablet (25 mg total) by mouth every 6 (six) hours as needed for nausea. 06/24/14  Yes Kenney Houseman  Tawni LevyS Pratt, MD    Family History  Family History  Problem Relation Age of Onset  . Parkinsonism Father   . Cancer Neg Hx   . Alcohol abuse Neg Hx   . Depression Neg Hx   . Diabetes Neg Hx   . Hypertension Neg Hx   . Hyperlipidemia Neg Hx   . Heart attack Maternal Grandfather     Social History  History   Social History  . Marital Status: Married    Spouse Name: N/A    Number of Children: N/A  . Years of Education: N/A   Occupational History  . Not on file.   Social History Main Topics  . Smoking status: Never Smoker   . Smokeless tobacco: Never Used  . Alcohol Use: No  . Drug Use: No  . Sexual Activity: Yes     Comment: Pregnant approx 8 wks   Other  Topics Concern  . Not on file   Social History Narrative  . No narrative on file     Review of Systems, as per HPI, otherwise negative General:  No chills, fever, night sweats or weight changes.  Cardiovascular:  No chest pain, dyspnea on exertion, edema, orthopnea, palpitations, paroxysmal nocturnal dyspnea. Dermatological: No rash, lesions/masses Respiratory: No cough, dyspnea Urologic: No hematuria, dysuria Abdominal:   No nausea, vomiting, diarrhea, bright red blood per rectum, melena, or hematemesis Neurologic:  No visual changes, wkns, changes in mental status. All other systems reviewed and are otherwise negative except as noted above.  Physical Exam  Blood pressure 142/88, pulse 93, height 5\' 6"  (1.676 m), weight 339 lb (153.769 kg), SpO2 98.00%.  General: Pleasant, NAD Psych: Normal affect. Neuro: Alert and oriented X 3. Moves all extremities spontaneously. HEENT: Normal  Neck: Supple without bruits or JVD. Lungs:  Resp regular and unlabored, CTA. Heart: RRR no s3, s4, or murmurs. Abdomen: Soft, non-tender, non-distended, BS + x 4.  Extremities: No clubbing, cyanosis or edema. DP/PT/Radials 2+ and equal bilaterally.  Labs:  No results found for this basename: CKTOTAL, CKMB, TROPONINI,  in the last 72 hours Lab Results  Component Value Date   WBC 9.1 07/04/2014   HGB 12.4 07/04/2014   HCT 36.1 07/04/2014   MCV 85.5 07/04/2014   PLT 415* 07/04/2014    No results found for this basename: DDIMER   No components found with this basename: POCBNP,     Component Value Date/Time   NA 140 07/04/2014 1439   K 4.3 07/04/2014 1439   CL 103 07/04/2014 1439   CO2 27 07/04/2014 1439   GLUCOSE 118* 07/04/2014 1439   BUN 10 07/04/2014 1439   CREATININE 0.76 07/04/2014 1439   CREATININE 0.77 08/08/2013 1905   CALCIUM 9.2 07/04/2014 1439   PROT 6.9 12/07/2012 1211   ALBUMIN 4.4 12/07/2012 1211   AST 16 12/07/2012 1211   ALT 16 12/07/2012 1211   ALKPHOS 77 12/07/2012 1211   BILITOT 0.4  12/07/2012 1211   GFRNONAA >90 08/08/2013 1905   GFRAA >90 08/08/2013 1905   Lab Results  Component Value Date   CHOL 143 12/07/2012   HDL 49 12/07/2012   LDLCALC 78 12/07/2012   TRIG 79 12/07/2012    Accessory Clinical Findings  Echocardiogram - 08/16/2014  Study Conclusions  - Left ventricle: The cavity size was normal. There was mild concentric hypertrophy. Systolic function was normal. The estimated ejection fraction was in the range of 50% to 55%. Wall motion was normal; there were no regional wall  motion abnormalities. Left ventricular diastolic function parameters were normal. - Aortic root: The aortic root was normal in size. Ascending aorta upper normal size measuring 4 cm. - Mitral valve: Structurally normal valve. There was no regurgitation. - Left atrium: The atrium was normal in size. - Right ventricle: Systolic function was normal. - Right atrium: The atrium was normal in size. - Tricuspid valve: There was no regurgitation. - Pericardium, extracardiac: There was no pericardial effusion.  Impressions: - Mild LVH, otherwise normal study.  ECG - Sinus tachycardia, otherwise normal study.    Assessment & Plan  Anxious appearing morbidly obese 33 year old female   1. Palpitations, frequent PVCs - we will start carvedilol 6.25 mg PO BID  2. Hypertension - start carvedilol, echo shows mild LVH  The patient is asking for disability papers to be filled, there is nothing from cardiology standpoint that would qualify her for disability. After talking to her and her husband I got the impression that they are seeking for secondary gain from extensive medical evaluation leading into disability.  Follow up in 3 weeks.  Lars Masson, MD, [facility] 08/16/2014, 12:11 PM

## 2014-08-16 NOTE — Progress Notes (Signed)
Patient ID: Amanda PiccoloDeborah L Iyengar, female   DOB: 02/13/1981, 33 y.o.   MRN: [id number]   [facility] Yolande Jollyaleb G Anavey Coombes, MD Phone: [phone]  Subjective:   # Medical Leave of Absence Paperwork - Pt. Here to have medical leave of absence paperwork filled out.  - She states that Dr. Lum BabeEniola is her PCP and that she had previously filled out MLA paperwork for her because she had an episode of vertigo requiring MLA approximately 2-3 months ago. She states that she has had ongoing symptoms of vertigo that make it difficult for her to walk, rise from sitting, concentrate, perform computer work, Catering manageretc.  - She says that she additionally is being worked up for [employer]'s.   All relevant systems were reviewed and were negative unless otherwise noted in the HPI  Past Medical History Reviewed problem list.  Medications- reviewed and updated Current Outpatient Prescriptions  Medication Sig Dispense Refill  . albuterol (VENTOLIN HFA) 108 (90 BASE) MCG/ACT inhaler Inhale 2 puffs into the lungs every 4 (four) hours as needed for wheezing or shortness of breath.  1 each  4  . b complex vitamins tablet Take 2 tablets by mouth daily.       . busPIRone (BUSPAR) 30 MG tablet Take 30 mg by mouth 2 (two) times daily.      . carvedilol (COREG) 6.25 MG tablet Take 1 tablet (6.25 mg total) by mouth 2 (two) times daily with a meal.  180 tablet  5  . Celery Seed OIL Take 2 capsules by mouth daily. Takes for BP      . Cholecalciferol (VITAMIN D3) 5000 UNITS TABS Take 1 tablet by mouth every morning.      . clonazePAM (KLONOPIN) 1 MG tablet Take 1 mg by mouth 3 (three) times daily.       . diazepam (VALIUM) 5 MG tablet Take 1 tablet (5 mg total) by mouth every 6 (six) hours as needed (for vertigo).  30 tablet  1  . Fluticasone-Salmeterol (ADVAIR) 100-50 MCG/DOSE AEPB Inhale 1 puff into the lungs daily as needed.      Marland Kitchen. ketotifen (ZADITOR) 0.025 % ophthalmic solution 1 drop 2 (two) times daily.      .  meclizine (ANTIVERT) 50 MG tablet Take 1 tablet (50 mg total) by mouth 3 (three) times daily as needed.  30 tablet  2  . metFORMIN (GLUCOPHAGE) 500 MG tablet Take 500 mg by mouth daily with breakfast.      . norethindrone-ethinyl estradiol-iron (ESTROSTEP FE,TILIA FE,TRI-LEGEST FE) 1-20/1-30/1-35 MG-MCG tablet Take 1 tablet by mouth daily.      . promethazine (PHENERGAN) 25 MG tablet Take 1 tablet (25 mg total) by mouth every 6 (six) hours as needed for nausea.  42 tablet  2   No current facility-administered medications for this visit.   Chief complaint-noted No additions to family history Social history- patient is a non-smoker  Objective: BP 179/102  Pulse 115  Wt 339 lb (153.769 kg) Gen: NAD, alert, cooperative with exam HEENT: NCAT, EOMI, PERRL, No nystagmus noted with sudden head movements.  Neck: FROM, supple CV: RRR, good S1/S2, no murmur Resp: CTABL, no wheezes, non-labored Abd: SNTND, BS present, no guarding or organomegaly Ext: No edema, warm, normal tone, moves UE/LE spontaneously Neuro: Alert and oriented, No gross deficits Skin: no rashes no lesions  Assessment/Plan: See problem based a/p

## 2014-09-10 ENCOUNTER — Encounter: Payer: 59 | Attending: Family Medicine | Admitting: *Deleted

## 2014-09-10 DIAGNOSIS — Z713 Dietary counseling and surveillance: Secondary | ICD-10-CM | POA: Insufficient documentation

## 2014-09-10 DIAGNOSIS — R7303 Prediabetes: Secondary | ICD-10-CM

## 2014-09-10 DIAGNOSIS — E282 Polycystic ovarian syndrome: Secondary | ICD-10-CM | POA: Diagnosis not present

## 2014-09-10 DIAGNOSIS — R7309 Other abnormal glucose: Secondary | ICD-10-CM | POA: Insufficient documentation

## 2014-09-10 DIAGNOSIS — Z6841 Body Mass Index (BMI) 40.0 and over, adult: Secondary | ICD-10-CM | POA: Insufficient documentation

## 2014-09-10 NOTE — Progress Notes (Signed)
  Medical Nutrition Therapy:  Appt start time: 1700 end time:  1800.  Assessment:  Primary concerns today: Amanda Massey is here for follow up nutrition counseling pertaining to obesity, PCOS, and prediabetes.   She has been home-bound for 3 months due to severe vertigo and has not made much progress towards adopting healthier lifestyle choices.  However, her mother has been staying with her while she's been home-bound and mom is very interested in healthy eating.  Mom has adopted a "pseudo-paleo diet" in the household and cooks with coconut oil and eats gluten-free.  Mom also doesn't buy as many processed foods so they are not around for Amanda Massey to eat.  Amanda Massey feels she has lost weight and would like to think her health has improve somewhat.  Her psychiatrist has since retired and she is trying to find a new one.  She has an appointment later this week with a new psychiatrist: Dr. Awilda MetroPlofsky with [facility].  An on-call psychiatrist changed her to Celexa and she is doing really well with that with her depression.  She feels her anxiety is improving, but her anxiety is high with large crowds.   She has been seeing a cardiologist and she is concnered that her PCOS is affecting her cardiovascular health   Preferred Learning Style:   No preference indicated   Learning Readiness:   Contemplating: cancelled or did not show for 10 nutrition appointments.  Might not be ready for lifestyle change   MEDICATIONS: see list.  Metformin 500 and lo-lestrin OCP for PCOS   DIETARY INTAKE:  Not enough time to access at this visit  Usual physical activity: none  Estimated energy needs: 1800 calories 200 g carbohydrates 135 g protein 50 g fat    Nutritional Diagnosis:  [place]-2.1 Inpaired nutrition utilization As related to carbohydrates.  As evidenced by PCOS.    Intervention: Discussed treatment options for PCOS.  Discussed recommended dosing for metformin and suggested considering visit with  endocrinology, especially since she is already in the prediabetes stage.  Suggested routine blood work including lipid panel and A1c.  Revisited inositol supplementation to help manage PCOS symptoms and preserve health.  Discussed mental health treatment and suggested therapy in additional to psychiatry.  Referred to [facility] as they offer panic and anxiety counseling services.   Teaching Method Utilized:  Auditory    Barriers to learning/adherence to lifestyle change:mental health and physical challenges (vertigo)  Demonstrated degree of understanding via:  Teach Back   Monitoring/Evaluation:  Dietary intake, exercise, lab data, and body weight prn  Will make appointment with endocrinology and then make appointment with nutrition afterwards.

## 2014-09-10 NOTE — Patient Instructions (Signed)
Call Dr. Elvera LennoxGherghe, endocrinology Follow [facility] and [facility] Consider Ovasitol supplement Talk to providers about A1c blood work and lipid panel

## 2014-09-11 ENCOUNTER — Ambulatory Visit (INDEPENDENT_AMBULATORY_CARE_PROVIDER_SITE_OTHER): Payer: 59 | Admitting: Cardiology

## 2014-09-11 ENCOUNTER — Encounter: Payer: Self-pay | Admitting: Cardiology

## 2014-09-11 VITALS — BP 130/94 | HR 93 | Ht 66.0 in | Wt 344.8 lb

## 2014-09-11 DIAGNOSIS — I10 Essential (primary) hypertension: Secondary | ICD-10-CM | POA: Insufficient documentation

## 2014-09-11 DIAGNOSIS — R002 Palpitations: Secondary | ICD-10-CM

## 2014-09-11 MED ORDER — CARVEDILOL 12.5 MG PO TABS: 12.5000 mg | ORAL_TABLET | Freq: Two times a day (BID) | ORAL | Status: DC

## 2014-09-11 NOTE — Progress Notes (Signed)
Patient ID: Amanda PiccoloDeborah L Massey, female   DOB: 1981-05-17, 33 y.o.   MRN: [id number]    Patient Name: Amanda Massey Date of Encounter: 09/11/2014  Primary Care Provider:  Janit PaganENIOLA, KEHINDE, Massey Primary Cardiologist:  Amanda Massey  Problem List   Past Medical History  Diagnosis Date  . PCOS (polycystic ovarian syndrome)   . Anxiety   . Asthma   . Vertigo   . Prediabetes   . Obesity   . Hypertension     no rx meds  . Seasonal allergies   . Back pain     tx with OTC meds   Past Surgical History  Procedure Laterality Date  . Wisdom tooth extraction    . Multiple tooth extractions      for braces  . Svd      x 1  . Dilation and evacuation  03/17/2012    Procedure: DILATATION AND EVACUATION;  Surgeon: Jeani HawkingMichelle L Grewal, Massey;  Location: [facility];  Service: Gynecology;  Laterality: N/A;   Allergies  No Known Allergies  HPI  A very anxious appearing female coming for evaluation of dizziness and palpitations. She has been followed by her PCP for dizziness and started on Meclizine with significant improvement of symptoms. No syncope.  She underwent 48 hour Holter monitoring that showed sinus brady to tachycardia and frequent PVCs (1500 in 24 hours). She denies any chest pain, has some SOB on exertion. She doesn't exercise.  No LE edema. No orthopnea, PND. She has never smoked. No FH of prematuer CAD. She mentions multiple times during conversation that she is anxious and so is her husband.   09/11/2014 - significant improvement in her symptoms - palpitations rare now, BP better controlled, but still diastolic in 90'. No syncope, no chest pain, no LE edema. No fatigue with carvedilol.   Home Medications  Prior to Admission medications   Medication Sig Start Date End Date Taking? Authorizing Provider  albuterol (VENTOLIN HFA) 108 (90 BASE) MCG/ACT inhaler Inhale 2 puffs into the lungs every 4 (four) hours as needed for wheezing or shortness of breath. 04/04/14  Yes Amanda PaganKehinde  Eniola, Massey  b complex vitamins tablet Take 2 tablets by mouth daily.    Yes Historical Provider, Massey  busPIRone (BUSPAR) 30 MG tablet Take 30 mg by mouth 2 (two) times daily.   Yes Historical Provider, Massey  Celery Seed OIL Take 2 capsules by mouth daily. Takes for BP   Yes Historical Provider, Massey  Cholecalciferol (VITAMIN D3) 5000 UNITS TABS Take 1 tablet by mouth every morning.   Yes Historical Provider, Massey  clonazePAM (KLONOPIN) 1 MG tablet Take 1 mg by mouth 3 (three) times daily.    Yes Historical Provider, Massey  diazepam (VALIUM) 5 MG tablet Take 1 tablet (5 mg total) by mouth every 6 (six) hours as needed (for vertigo). 06/24/14  Yes Reva Boresanya S Pratt, Massey  Fluticasone-Salmeterol (ADVAIR) 100-50 MCG/DOSE AEPB Inhale 1 puff into the lungs daily as needed. 12/21/13  Yes Amanda PaganKehinde Eniola, Massey  ketotifen (ZADITOR) 0.025 % ophthalmic solution 1 drop 2 (two) times daily.   Yes Historical Provider, Massey  meclizine (ANTIVERT) 50 MG tablet Take 1 tablet (50 mg total) by mouth 3 (three) times daily as needed. 06/24/14  Yes Reva Boresanya S Pratt, Massey  metFORMIN (GLUCOPHAGE) 500 MG tablet Take 500 mg by mouth daily with breakfast.   Yes Historical Provider, Massey  norethindrone-ethinyl estradiol-iron (ESTROSTEP FE,TILIA FE,TRI-LEGEST FE) 1-20/1-30/1-35 MG-MCG tablet Take 1 tablet by mouth daily.  Yes Historical Provider, Massey  promethazine (PHENERGAN) 25 MG tablet Take 1 tablet (25 mg total) by mouth every 6 (six) hours as needed for nausea. 06/24/14  Yes Reva Boresanya S Pratt, Massey    Family History  Family History  Problem Relation Age of Onset  . Parkinsonism Father   . Cancer Neg Hx   . Alcohol abuse Neg Hx   . Depression Neg Hx   . Diabetes Neg Hx   . Hypertension Neg Hx   . Hyperlipidemia Neg Hx   . Heart attack Maternal Grandfather     Social History  History   Social History  . Marital Status: Married    Spouse Name: N/A    Number of Children: N/A  . Years of Education: N/A   Occupational History  . Not on file.    Social History Main Topics  . Smoking status: Never Smoker   . Smokeless tobacco: Never Used  . Alcohol Use: No  . Drug Use: No  . Sexual Activity: Yes     Comment: Pregnant approx 8 wks   Other Topics Concern  . Not on file   Social History Narrative  . No narrative on file     Review of Systems, as per HPI, otherwise negative General:  No chills, fever, night sweats or weight changes.  Cardiovascular:  No chest pain, dyspnea on exertion, edema, orthopnea, palpitations, paroxysmal nocturnal dyspnea. Dermatological: No rash, lesions/masses Respiratory: No cough, dyspnea Urologic: No hematuria, dysuria Abdominal:   No nausea, vomiting, diarrhea, bright red blood per rectum, melena, or hematemesis Neurologic:  No visual changes, wkns, changes in mental status. All other systems reviewed and are otherwise negative except as noted above.  Physical Exam  Blood pressure 130/94, pulse 93, height 5\' 6"  (1.676 m), weight 344 lb 12.8 oz (156.4 kg), SpO2 97.00%.  General: Pleasant, NAD Psych: Normal affect. Neuro: Alert and oriented X 3. Moves all extremities spontaneously. HEENT: Normal  Neck: Supple without bruits or JVD. Lungs:  Resp regular and unlabored, CTA. Heart: RRR no s3, s4, or murmurs. Abdomen: Soft, non-tender, non-distended, BS + x 4.  Extremities: No clubbing, cyanosis or edema. DP/PT/Radials 2+ and equal bilaterally.  Labs:  No results found for this basename: CKTOTAL, CKMB, TROPONINI,  in the last 72 hours Lab Results  Component Value Date   WBC 9.1 07/04/2014   HGB 12.4 07/04/2014   HCT 36.1 07/04/2014   MCV 85.5 07/04/2014   PLT 415* 07/04/2014    No results found for this basename: DDIMER   No components found with this basename: POCBNP,     Component Value Date/Time   NA 140 07/04/2014 1439   K 4.3 07/04/2014 1439   CL 103 07/04/2014 1439   CO2 27 07/04/2014 1439   GLUCOSE 118* 07/04/2014 1439   BUN 10 07/04/2014 1439   CREATININE 0.76 07/04/2014 1439    CREATININE 0.77 08/08/2013 1905   CALCIUM 9.2 07/04/2014 1439   PROT 6.9 12/07/2012 1211   ALBUMIN 4.4 12/07/2012 1211   AST 16 12/07/2012 1211   ALT 16 12/07/2012 1211   ALKPHOS 77 12/07/2012 1211   BILITOT 0.4 12/07/2012 1211   GFRNONAA >90 08/08/2013 1905   GFRAA >90 08/08/2013 1905   Lab Results  Component Value Date   CHOL 143 12/07/2012   HDL 49 12/07/2012   LDLCALC 78 12/07/2012   TRIG 79 12/07/2012    Accessory Clinical Findings  Echocardiogram - 08/16/2014  Study Conclusions  - Left ventricle: The cavity size  was normal. There was mild concentric hypertrophy. Systolic function was normal. The estimated ejection fraction was in the range of 50% to 55%. Wall motion was normal; there were no regional wall motion abnormalities. Left ventricular diastolic function parameters were normal. - Aortic root: The aortic root was normal in size. Ascending aorta upper normal size measuring 4 cm. - Mitral valve: Structurally normal valve. There was no regurgitation. - Left atrium: The atrium was normal in size. - Right ventricle: Systolic function was normal. - Right atrium: The atrium was normal in size. - Tricuspid valve: There was no regurgitation. - Pericardium, extracardiac: There was no pericardial effusion.  Impressions: - Mild LVH, otherwise normal study.  ECG - Sinus tachycardia, otherwise normal study.    Assessment & Plan  Morbidly obese 33 year old female   1. Palpitations, frequent PVCs - we will increase carvedilol to 12.5 mg PO BID  2. Hypertension - increase carvedilol, echo shows mild LVH  3. Obesity - encouraged to exercise  Follow up in 2 months.   Amanda Masson, Massey, [facility] 09/11/2014, 2:59 PM

## 2014-09-11 NOTE — Patient Instructions (Signed)
Your physician has recommended you make the following change in your medication:   INCREASE YOUR CARVEDILOL TO 12.5 MG TWICE DAILY   Your physician recommends that you schedule a follow-up appointment in: 2 MONTHS WITH DR Delton SeeNELSON

## 2014-09-16 ENCOUNTER — Encounter: Payer: Self-pay | Admitting: Cardiology

## 2014-09-23 ENCOUNTER — Encounter: Payer: Self-pay | Admitting: Internal Medicine

## 2014-09-23 ENCOUNTER — Ambulatory Visit (INDEPENDENT_AMBULATORY_CARE_PROVIDER_SITE_OTHER): Payer: 59 | Admitting: Internal Medicine

## 2014-09-23 ENCOUNTER — Ambulatory Visit: Payer: 59 | Admitting: Internal Medicine

## 2014-09-23 VITALS — BP 152/92 | HR 92 | Temp 98.3°F | Resp 12 | Ht 66.0 in | Wt 343.0 lb

## 2014-09-23 DIAGNOSIS — R7303 Prediabetes: Secondary | ICD-10-CM

## 2014-09-23 DIAGNOSIS — R7309 Other abnormal glucose: Secondary | ICD-10-CM

## 2014-09-23 LAB — HEPATIC FUNCTION PANEL
ALT: 22 U/L (ref 0–35)
AST: 21 U/L (ref 0–37)
Albumin: 3.2 g/dL — ABNORMAL LOW (ref 3.5–5.2)
Alkaline Phosphatase: 60 U/L (ref 39–117)
BILIRUBIN TOTAL: 0.4 mg/dL (ref 0.2–1.2)
Bilirubin, Direct: 0 mg/dL (ref 0.0–0.3)
Total Protein: 7.5 g/dL (ref 6.0–8.3)

## 2014-09-23 LAB — LIPID PANEL
CHOL/HDL RATIO: 4
CHOLESTEROL: 156 mg/dL (ref 0–200)
HDL: 37.4 mg/dL — ABNORMAL LOW (ref >39.00)
LDL CALC: 84 mg/dL (ref 0–99)
NonHDL: 118.6
Triglycerides: 173 mg/dL — ABNORMAL HIGH (ref 0.0–149.0)
VLDL: 34.6 mg/dL (ref 0.0–40.0)

## 2014-09-23 LAB — HEMOGLOBIN A1C: HEMOGLOBIN A1C: 5.8 % (ref 4.6–6.5)

## 2014-09-23 MED ORDER — METFORMIN HCL 500 MG PO TABS: 1000.0000 mg | ORAL_TABLET | Freq: Two times a day (BID) | ORAL | Status: DC

## 2014-09-23 NOTE — Progress Notes (Signed)
Patient ID: Amanda Massey, female   DOB: January 14, 1981, 33 y.o.   MRN: [id number]  HPI: Amanda Massey is a 33 y.o.-year-old female, referred by her nutritionist, Denny Levy for management of PCOS and prediabetes. ObGyn: Dr. Adalberto Ill.  She was dx with PCOS in 2009.  Fertility/Menstrual cycles: - menarche at 33 y/o - on OCP at 33 y/o >> then came off OCPs at 33 y/o - irregular menses after coming off OCP >> 3x a year - it took her 2 years to get pregnant, used Clomid - saw Dr Elesa Hacker >> tried many IUI tx's, other procedures except IVF >> did not work - She would like to get showed left to get pregnant again, but she realizes this is very low chance - + h/o ovarian cysts - children: 1, but tried for another one  - miscarriages: 1 - lost baby at 8 weeks in 2013 - contraception: OCPs now - LoLoestrin - started it 6 mo - likes it (25$ a mo)  Weight gain: - stayed stable for most of her adult life - 2 years ago lost 60 lbs over 6 mo - diet (low carb) and exercise  - no steroid use, other than Prednisone for URI - no weight loss meds - working with nutrition, Denny Levy - Meals: - Breakfast: she usually skips it, but if she eats then cereal or eggs/cheese biscuit - Lunch: sandwich - Dinner: some fast food - Snacks: very rare usually not Drinks: no soft drinks every day, mostly water or milk - Diets tried: decreased portions, less starches - Exercise: not consistently  Acne: - no  Hirsutism: - upper lip/neck/chin - is using hair removal creams - also shaving  Treatments tried: - on Metformin - did not try Spironolactone - did not try Vaniqua - on OCPs ([facility])  - Last thyroid tests: Lab Results  Component Value Date   TSH 2.71 08/16/2014   FREET4 1.03 08/16/2014   - Last set of lipids:    Component Value Date/Time   CHOL 143 12/07/2012 1211   TRIG 79 12/07/2012 1211   HDL 49 12/07/2012 1211   CHOLHDL 2.9 12/07/2012 1211   VLDL 16 12/07/2012 1211   LDLCALC 78 12/07/2012 1211   - Last HbA1c: Lab Results  Component Value Date   HGBA1C 5.6 04/25/2013   - last CMP:   Chemistry      Component Value Date/Time   NA 140 07/04/2014 1439   K 4.3 07/04/2014 1439   CL 103 07/04/2014 1439   CO2 27 07/04/2014 1439   BUN 10 07/04/2014 1439   CREATININE 0.76 07/04/2014 1439   CREATININE 0.77 08/08/2013 1905      Component Value Date/Time   CALCIUM 9.2 07/04/2014 1439   ALKPHOS 77 12/07/2012 1211   AST 16 12/07/2012 1211   ALT 16 12/07/2012 1211   BILITOT 0.4 12/07/2012 1211     Prediabetes: Last hemoglobin A1c was: Lab Results  Component Value Date   HGBA1C 5.6 04/25/2013   HGBA1C 5.5 12/07/2012   Pt is on a regimen of: - Metformin 500 mg po daily in a.m. She was on Byetta in the past.  No FH of DM; she has a h/o GDM in 2003.   ROS: Constitutional: + weight gain, + fatigue, no subjective hyperthermia/hypothermia Eyes: no blurry vision, no xerophthalmia ENT: no sore throat, no nodules palpated in throat, no dysphagia/odynophagia, no hoarseness Cardiovascular: no CP/SOB/+ palpitations/no leg swelling Respiratory: no cough/SOB Gastrointestinal: no N/V/D/C Musculoskeletal: no muscle/+  joint aches Skin: no acne, + hair on face Neurological: no tremors/numbness/tingling/dizziness Psychiatric: + both: depression/anxiety + low libido  Past Medical History  Diagnosis Date  . PCOS (polycystic ovarian syndrome)   . Anxiety   . Asthma   . Vertigo   . Prediabetes   . Obesity   . Hypertension     no rx meds  . Seasonal allergies   . Back pain     tx with OTC meds   Past Surgical History  Procedure Laterality Date  . Wisdom tooth extraction    . Multiple tooth extractions      for braces  . Svd      x 1  . Dilation and evacuation  03/17/2012    Procedure: DILATATION AND EVACUATION;  Surgeon: Jeani Hawking, MD;  Location: [facility];  Service: Gynecology;  Laterality: N/A;   History   Social History  . Marital  Status: Married    Spouse Name: N/A    Number of Children: 1   Occupational History  . 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 Smoking status: Never Smoker   . Smokeless tobacco: Never Used  . Alcohol Use: No  . Drug Use: No   Social History Narrative   Current Outpatient Prescriptions on File Prior to Visit  Medication Sig Dispense Refill  . albuterol (VENTOLIN HFA) 108 (90 BASE) MCG/ACT inhaler Inhale 2 puffs into the lungs every 4 (four) hours as needed for wheezing or shortness of breath. 1 each 4  . b complex vitamins tablet Take 2 tablets by mouth daily.     . busPIRone (BUSPAR) 30 MG tablet Take 30 mg by mouth 2 (two) times daily.    . carvedilol (COREG) 12.5 MG tablet Take 1 tablet (12.5 mg total) by mouth 2 (two) times daily. 180 tablet 3  . Cholecalciferol (VITAMIN D3) 5000 UNITS TABS Take 1 tablet by mouth every morning.    . citalopram (CELEXA) 20 MG tablet Take 20 mg by mouth 2 (two) times daily.    . clonazePAM (KLONOPIN) 1 MG tablet Take 1 mg by mouth 3 (three) times daily.     . diazepam (VALIUM) 5 MG tablet Take 1 tablet (5 mg total) by mouth every 6 (six) hours as needed (for vertigo). 30 tablet 1  . Fluticasone-Salmeterol (ADVAIR) 100-50 MCG/DOSE AEPB Inhale 1 puff into the lungs daily as needed.    Marland Kitchen ketotifen (ZADITOR) 0.025 % ophthalmic solution 1 drop 2 (two) times daily.    . metFORMIN (GLUCOPHAGE) 500 MG tablet Take 500 mg by mouth daily with breakfast.    . norethindrone-ethinyl estradiol-iron (ESTROSTEP FE,TILIA FE,TRI-LEGEST FE) 1-20/1-30/1-35 MG-MCG tablet Take 1 tablet by mouth daily.    . meclizine (ANTIVERT) 25 MG tablet Take 25 mg by mouth 3 (three) times daily as needed for dizziness.    . promethazine (PHENERGAN) 25 MG tablet Take 1 tablet (25 mg total) by mouth every 6 (six) hours as needed for nausea. 42 tablet 2   No current facility-administered medications on file prior to visit.   No Known  Allergies Family History  Problem Relation Age of Onset  . Parkinsonism Father   . Cancer Neg Hx   . Alcohol abuse Neg Hx   . Depression Neg Hx   . Diabetes Neg Hx   . Hypertension Neg Hx   . Hyperlipidemia Neg Hx   . Heart attack Maternal Grandfather    PE: BP  152/92 mmHg  Pulse 92  Temp(Src) 98.3 F (36.8 C) (Oral)  Resp 12  Ht 5\' 6"  (1.676 m)  Wt 343 lb (155.584 kg)  BMI 55.39 kg/m2  SpO2 97% Wt Readings from Last 3 Encounters:  09/23/14 343 lb (155.584 kg)  09/11/14 344 lb 12.8 oz (156.4 kg)  08/16/14 339 lb (153.769 kg)   Constitutional: obesity class III, in NAD, no full supraclavicular fat pads Eyes: PERRLA, EOMI, no exophthalmos ENT: moist mucous membranes, no thyromegaly, no cervical lymphadenopathy Cardiovascular: RRR, No MRG Respiratory: CTA B Gastrointestinal: abdomen soft, NT, ND, BS+ Musculoskeletal: no deformities, strength intact in all 4 Skin: moist, warm, no rashes, + mild acanthosis nigricans on neck, + no exophthalmos on sideburns, however slightly darker terminal hairs on chin Neurological: no tremor with outstretched hands, DTR normal in all 4  ASSESSMENT: 1. PCOSs  2. DM2, non-insulin-dependent, uncontrolled, without complication  3. Obesity class III BMI Classification:  < 18.5 underweight   18.5-24.9 normal weight   25.0-29.9 overweight   30.0-34.9 class I obesity   35.0-39.9 class II obesity   ? 40.0 class III obesity   PLAN:  1. PCOS I had a long discussion with the patient about the fact that the PCOS is a misnomer, a patient does not necessarily have to have polycystic ovaries to be diagnosed with the disorder. This is of sum of several conditions, including:  weight gain  insulin resistance (and therefore a higher risk of developing diabetes later in life)  acne  hirsutism  irregular menstrual cycles  decreased fertility. - We also discussed about the fact that the treatment is usually targeted to addressing the  problem that concerns the patient the most: acne/hirsutism, weight gain, or fertility, but there is no single treatment for PCOS.  - The first-line therapy are oral contraceptives. If she is concerned with her weight, we can use metformin; if she is concerned about acne/hirsutism, we can add spironolactone; and if she is concerned about fertility, I could refer her to reproductive endocrinology for possible use of clomiphene. - patient is already on oral contraceptives, and we discussed about the addition of spironolactone (however she is not very much bothered by her extra hair on chin, especially since this is blond). However she is very bothered by her weight, and I suggested to increase the dose of metformin 2000 mg twice a day, gradually. - More importantly, she should start and adhered to a diet of her choice - She got references for websites about PCOS from [place]aura  2. Patient with borderline prediabetes - Latest hemoglobin A1c levels in the normal range - She was on metformin 500 mg daily, we increased it today 1000 mg twice a day - We will recheck her hemoglobin A1c  3. Obesity class III - We have a long discussion about the fact that PCOS makes it difficult for her to lose weight, however she should continue to try, and find a diet that would work for her. If she does not lose weight, she is at risk of cardiovascular complications, diabetes, etc.  - We discussed at length about diet, and I suggested several for her to look up and see if they are a good match for her - I offered her more information about the vegan diet (please see patient instructions) - She continues to work with Vernona RiegerLaura in the nutrition department  - time spent with the patient: 1 hour, of which >50% was spent in obtaining information about her symptoms, reviewing her previous  labs, evaluations, and treatments, counseling her about her conditions (please see the discussed topics above), and developing a plan to further  investigate them. She had a number of questions which I addressed.  Today we will check the following Orders Placed This Encounter  Procedures  . Testosterone, free, total  . Lipid Profile  . HgB A1c  . Hepatic function panel    Office Visit on 09/23/2014  Component Date Value Ref Range Status  . Testosterone 09/23/2014 71* 10 - 70 ng/dL Final   Comment:           Tanner Stage       Female              Female               I              < 30 ng/dL        < 10 ng/dL               II             < 150 ng/dL       < 30 ng/dL               III            100-320 ng/dL     < 35 ng/dL               IV             200-970 ng/dL     16-1015-40 ng/dL               V/Adult        300-890 ng/dL     96-0410-70 ng/dL     . Sex Hormone Binding 09/23/2014 55  18 - 114 nmol/L Final  . Testosterone, Free 09/23/2014 9.2* 0.6 - 6.8 pg/mL Final   Comment:   The concentration of free testosterone is derived from a mathematical expression based on constants for the binding of testosterone to sex hormone-binding globulin and albumin.   . Testosterone-% Free 09/23/2014 1.3  0.4 - 2.4 % Final  . Cholesterol 09/23/2014 156  0 - 200 mg/dL Final   ATP III Classification       Desirable:  < 200 mg/dL               Borderline High:  200 - 239 mg/dL          High:  > = 540240 mg/dL  . Triglycerides 09/23/2014 173.0* 0.0 - 149.0 mg/dL Final   Normal:  <981<150 mg/dLBorderline High:  150 - 199 mg/dL  . HDL 09/23/2014 37.40* >39.00 mg/dL Final  . VLDL 19/14/782911/07/2014 34.6  0.0 - 40.0 mg/dL Final  . LDL Cholesterol 09/23/2014 84  0 - 99 mg/dL Final  . Total CHOL/HDL Ratio 09/23/2014 4   Final                  Men          Women1/2 Average Risk     3.4          3.3Average Risk          5.0          4.42X Average Risk          9.6          7.13X Average Risk          15.0  11.0                      . NonHDL 09/23/2014 118.60   Final   NOTE:  Non-HDL goal should be 30 mg/dL higher than patient's LDL goal (i.e. LDL goal of < 70  mg/dL, would have non-HDL goal of < 100 mg/dL)  . Hgb A1c MFr Bld 09/23/2014 5.8  4.6 - 6.5 % Final   Glycemic Control Guidelines for People with Diabetes:Non Diabetic:  <6%Goal of Therapy: <7%Additional Action Suggested:  >8%   . Total Bilirubin 09/23/2014 0.4  0.2 - 1.2 mg/dL Final  . Bilirubin, Direct 09/23/2014 0.0  0.0 - 0.3 mg/dL Final  . Alkaline Phosphatase 09/23/2014 60  39 - 117 U/L Final  . AST 09/23/2014 21  0 - 37 U/L Final  . ALT 09/23/2014 22  0 - 35 U/L Final  . Total Protein 09/23/2014 7.5  6.0 - 8.3 g/dL Final  . Albumin 16/08/9603 3.2* 3.5 - 5.2 g/dL Final   Free testosterone still high >> may need a higher Estrogen dose...  The HbA1c is a little high >> increasing Metformin will help. LFTs normal.  LDL OK.

## 2014-09-23 NOTE — Patient Instructions (Signed)
Please increase Metformin to 1000 mg 2x a day.  Please add another Metformin tablet (500 mg) with dinner x 4 days. If you tolerate this well, add another metformin tablet with dinner (total 1000 mg) x 4 days. If you tolerate this well, add another metformin tablet with breakfast (total 1000 mg). Continue with 1000 mg of metformin 2x a day with breakfast and dinner.  Please research the following diets: - vegan diet - plan z diet - hormone reset diet  Please consider the following ways to cut down carbs and fat and increase fiber and micronutrients in your diet:  - substitute whole grain for white bread or pasta - substitute brown rice for white rice - substitute 90-calorie flat bread pieces for slices of bread when possible - substitute sweet potatoes or yams for white potatoes - substitute humus for margarine - substitute tofu for cheese when possible - substitute almond or rice milk for regular milk (would not drink soy milk daily due to concern for soy estrogen influence on breast cancer risk) - substitute dark chocolate for other sweets when possible - substitute water - can add lemon or orange slices for taste - for diet sodas (artificial sweeteners will trick your body that you can eat sweets without getting calories and will lead you to overeating and weight gain in the long run) - do not skip breakfast or other meals (this will slow down the metabolism and will result in more weight gain over time)  - can try smoothies made from fruit and almond/rice milk in am instead of regular breakfast - can also try old-fashioned (not instant) oatmeal made with almond/rice milk in am - order the dressing on the side when eating salad at a restaurant (pour less than half of the dressing on the salad) - eat as little meat as possible - can try juicing, but should not forget that juicing will get rid of the fiber, so would alternate with eating raw veg./fruits or drinking smoothies - use as little  oil as possible, even when using olive oil - can dress a salad with a mix of balsamic vinegar and lemon juice, for e.g. - use agave nectar, stevia sugar, or regular sugar rather than artificial sweateners - steam or broil/roast veggies  - snack on veggies/fruit/nuts (unsalted, preferably) when possible, rather than processed foods - reduce or eliminate aspartame in diet (it is in diet sodas, chewing gum, etc) Read the labels!  Try to read Dr. Katherina RightNeal Barnard's book: "Program for Reversing Diabetes" for the vegan concept and other ideas for healthy eating.  Plant-based diet materials: - Lectures (you tube):  Lequita AsalNeal Barnard: "Breaking the Food Seduction"  Doug Lisle: "How to Lose Weight, without Losing Your Mind"  Lucile CraterRodney Snow: "What is Insulin Resistance" TucsonEntrepreneur.sihttp://www.youtube.com/watch?v=k5iM6A67z3Y - Documentaries:  Supersize Me  Food Inc[employer]  Forks over BorgWarnerKnives  Vegucated  Fat, Sick and Nearly Dead  The Edison InternationalWeight of the Nation[employer] Mutual Insuranceation  Overweight and undernourished - Books:  Lequita AsalNeal Barnard: "Program for Reversing Diabetes"  Ferol LuzColin Campbell: "The Armeniahina Study"  Konrad PentaDonna Klein: "Supermarket Vegan" (cookbook) - Facebook pages:   Forks versus Knives  Vegucated  Toys ''R'' UsVegNews Magazine  Food Matters - Healthy nutrition info websites:  LateTelevision.com.eehttp://nutritionfacts.org/

## 2014-09-24 ENCOUNTER — Telehealth: Payer: Self-pay | Admitting: *Deleted

## 2014-09-24 ENCOUNTER — Encounter: Payer: Self-pay | Admitting: Family Medicine

## 2014-09-24 ENCOUNTER — Ambulatory Visit (INDEPENDENT_AMBULATORY_CARE_PROVIDER_SITE_OTHER): Payer: 59 | Admitting: Family Medicine

## 2014-09-24 VITALS — BP 126/81 | HR 81 | Temp 98.1°F | Wt 343.0 lb

## 2014-09-24 DIAGNOSIS — F32A Depression, unspecified: Secondary | ICD-10-CM

## 2014-09-24 DIAGNOSIS — F329 Major depressive disorder, single episode, unspecified: Secondary | ICD-10-CM

## 2014-09-24 DIAGNOSIS — I10 Essential (primary) hypertension: Secondary | ICD-10-CM

## 2014-09-24 DIAGNOSIS — R42 Dizziness and giddiness: Secondary | ICD-10-CM

## 2014-09-24 DIAGNOSIS — R002 Palpitations: Secondary | ICD-10-CM

## 2014-09-24 DIAGNOSIS — F418 Other specified anxiety disorders: Secondary | ICD-10-CM

## 2014-09-24 DIAGNOSIS — F419 Anxiety disorder, unspecified: Secondary | ICD-10-CM

## 2014-09-24 LAB — TESTOSTERONE, FREE, TOTAL, SHBG
SEX HORMONE BINDING: 55 nmol/L (ref 18–114)
TESTOSTERONE: 71 ng/dL — AB (ref 10–70)
Testosterone, Free: 9.2 pg/mL — ABNORMAL HIGH (ref 0.6–6.8)
Testosterone-% Free: 1.3 % (ref 0.4–2.4)

## 2014-09-24 NOTE — Progress Notes (Signed)
Subjective:     Patient ID: Amanda Massey, female   DOB: 08/16/1981, 33 y.o.   MRN: [id number]  HPI Vertigo:Patient here for follow up, now feeling better, she had daily vertigo on meclizine prn up until Oct 28th when the dose of her coreg was increased by her cardiologist as per patient. She denies any dizziness today, doing well in general. HYQ:MVHQIONGEHTN:Compliant with her medication, here for follow up. Palpitation:Denies chest pain, no SOB, no palpitations, she recently went up on the dose of her Coreg. Anxiety/Depression: Doing well with her medication, she is now seeing a new psychiatrist who adjusted her medicine, since then she has been coping well, feels happy in general about everything.  Current Outpatient Prescriptions on File Prior to Visit  Medication Sig Dispense Refill  . albuterol (VENTOLIN HFA) 108 (90 BASE) MCG/ACT inhaler Inhale 2 puffs into the lungs every 4 (four) hours as needed for wheezing or shortness of breath. 1 each 4  . b complex vitamins tablet Take 2 tablets by mouth daily.     . busPIRone (BUSPAR) 30 MG tablet Take 30 mg by mouth 2 (two) times daily.    . carvedilol (COREG) 12.5 MG tablet Take 1 tablet (12.5 mg total) by mouth 2 (two) times daily. 180 tablet 3  . Cholecalciferol (VITAMIN D3) 5000 UNITS TABS Take 1 tablet by mouth every morning.    . citalopram (CELEXA) 20 MG tablet Take 20 mg by mouth 2 (two) times daily.    . clonazePAM (KLONOPIN) 1 MG tablet Take 1 mg by mouth 3 (three) times daily.     . Fluticasone-Salmeterol (ADVAIR) 100-50 MCG/DOSE AEPB Inhale 1 puff into the lungs daily as needed.    . metFORMIN (GLUCOPHAGE) 500 MG tablet Take 2 tablets (1,000 mg total) by mouth 2 (two) times daily with a meal. 120 tablet 5  . norethindrone-ethinyl estradiol-iron (ESTROSTEP FE,TILIA FE,TRI-LEGEST FE) 1-20/1-30/1-35 MG-MCG tablet Take 1 tablet by mouth daily.    . diazepam (VALIUM) 5 MG tablet Take 1 tablet (5 mg total) by mouth every 6 (six) hours as needed (for  vertigo). 30 tablet 1  . ketotifen (ZADITOR) 0.025 % ophthalmic solution 1 drop 2 (two) times daily.    . meclizine (ANTIVERT) 25 MG tablet Take 25 mg by mouth 3 (three) times daily as needed for dizziness.    . promethazine (PHENERGAN) 25 MG tablet Take 1 tablet (25 mg total) by mouth every 6 (six) hours as needed for nausea. 42 tablet 2   No current facility-administered medications on file prior to visit.   Past Medical History  Diagnosis Date  . PCOS (polycystic ovarian syndrome)   . Anxiety   . Asthma   . Vertigo   . Prediabetes   . Obesity   . Hypertension     no rx meds  . Seasonal allergies   . Back pain     tx with OTC meds     Review of Systems  Respiratory: Negative.   Cardiovascular: Negative.   Gastrointestinal: Negative.   Neurological: Negative.   All other systems reviewed and are negative.  Filed Vitals:   09/24/14 0852  BP: 126/81  Pulse: 81  Temp: 98.1 F (36.7 C)  TempSrc: Oral  Weight: 343 lb (155.584 kg)       Objective:   Physical Exam  Constitutional: She is oriented to person, place, and time. She appears well-developed. No distress.  Cardiovascular: Normal rate, regular rhythm, normal heart sounds and intact distal pulses.  No murmur heard. Pulmonary/Chest: Effort normal and breath sounds normal. No respiratory distress. She has no wheezes.  Abdominal: Soft. Bowel sounds are normal. She exhibits no distension and no mass. There is no tenderness.  Musculoskeletal: Normal range of motion. She exhibits no edema.  Neurological: She is alert and oriented to person, place, and time. She has normal strength. She displays normal reflexes. No cranial nerve deficit. She displays a negative Romberg sign.  Gait steady. Neg Hallpike.  Psychiatric: She has a normal mood and affect. Her behavior is normal. Judgment and thought content normal.  Nursing note and vitals reviewed.      Assessment:     Vertigo HTN Palpitation Anxiety/Depression      Plan:     Check problem list.

## 2014-09-24 NOTE — Assessment & Plan Note (Signed)
Resolved. May return to work. F/U as needed.

## 2014-09-24 NOTE — Telephone Encounter (Signed)
Spoke with patient and she is aware of this.  I was given fax number for Melissa in appeals department at [employer] to fax note to.  [phone].  It was [facility] and her case # [phone]. Jazmin Hartsell,CMA

## 2014-09-24 NOTE — Patient Instructions (Signed)
I am glad you feel better now, you may return to work tomorrow, I will see you as need or in 3 months

## 2014-09-24 NOTE — Telephone Encounter (Signed)
-----   Message from Janit PaganKehinde Eniola, MD sent at 09/24/2014  2:13 PM EST ----- Please inform patient her note is complete and can come get them. May be printed for her once she gets to the clinic.

## 2014-09-24 NOTE — Assessment & Plan Note (Signed)
Compliant with psych management. Stable on current regimen. Functionality improved. F/U with Psych as instructed.

## 2014-09-24 NOTE — Assessment & Plan Note (Signed)
Stable on Coreg. Asymptomatic. HR stable.

## 2014-09-24 NOTE — Assessment & Plan Note (Signed)
Well controlled on current regimen. Continue same. Monitor BP at home.

## 2014-10-03 ENCOUNTER — Ambulatory Visit: Payer: 59 | Admitting: Internal Medicine

## 2014-10-03 ENCOUNTER — Ambulatory Visit (INDEPENDENT_AMBULATORY_CARE_PROVIDER_SITE_OTHER): Payer: 59 | Admitting: Psychology

## 2014-10-03 DIAGNOSIS — F411 Generalized anxiety disorder: Secondary | ICD-10-CM

## 2014-10-12 IMAGING — CT CT HEAD WITHOUT CONTRAST
1 series · 16 of 30 positions shown, 20 images · non-contrast
Comparison: 08/08/2013

CLINICAL DATA: Dizziness, vertigo, shortness of breath started 4 hr
ago

EXAM:
CT HEAD WITHOUT CONTRAST
TECHNIQUE: Contiguous axial images were obtained from the base of the skull
through the vertex without intravenous contrast.

[Series 2: head wo · axial · 0.42mm/px · z∈[-105,+39]mm · 16 of 36 slices shown, 20 images]
[im 2/36  brain]
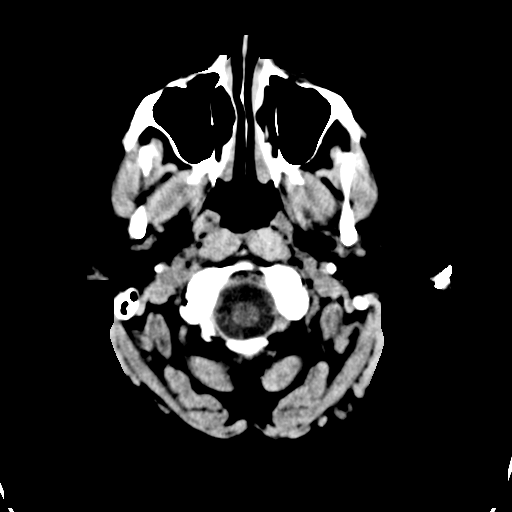
[im 2/36  bone]
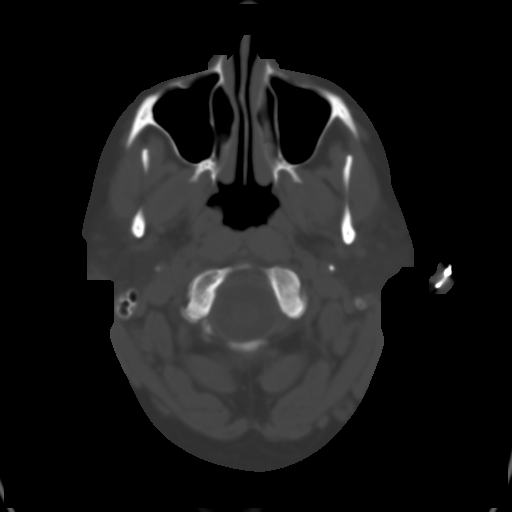
[im 4/36  brain]
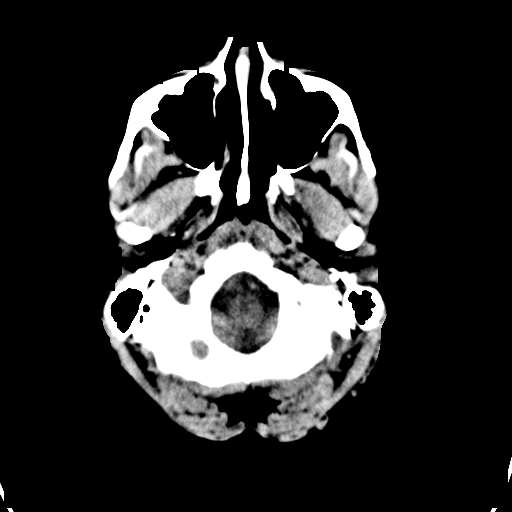
[im 7/36  brain]
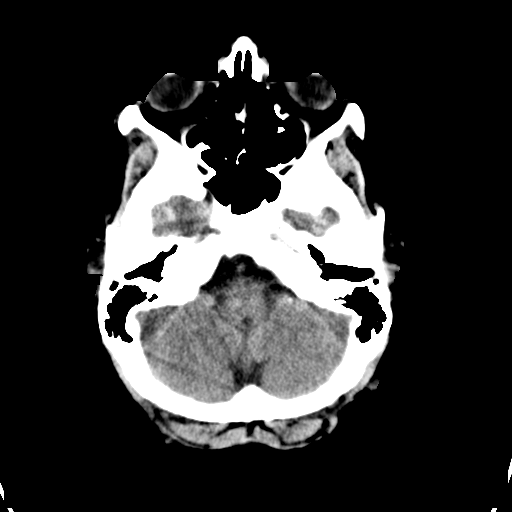
[im 9/36  brain]
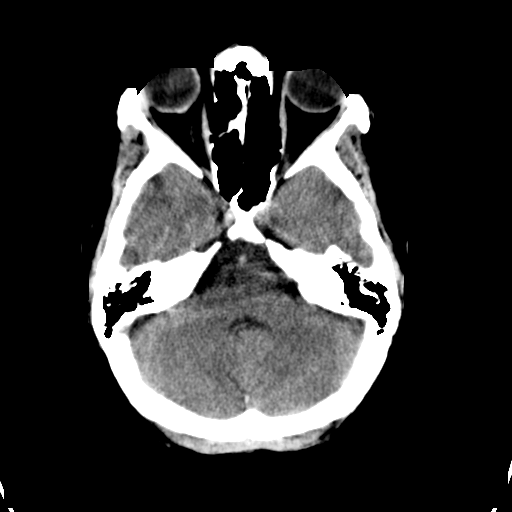
[im 10/36  brain]
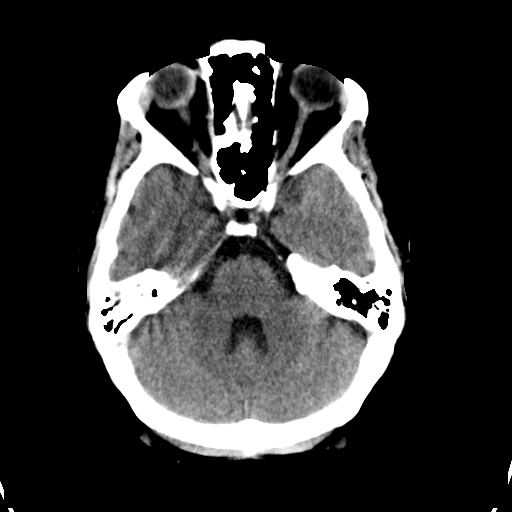
[im 10/36  bone]
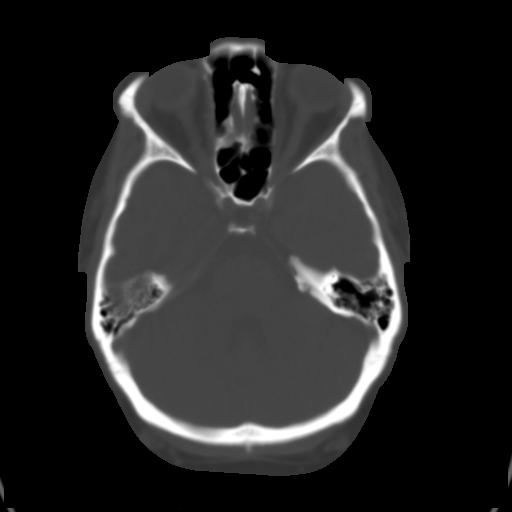
[im 13/36  brain]
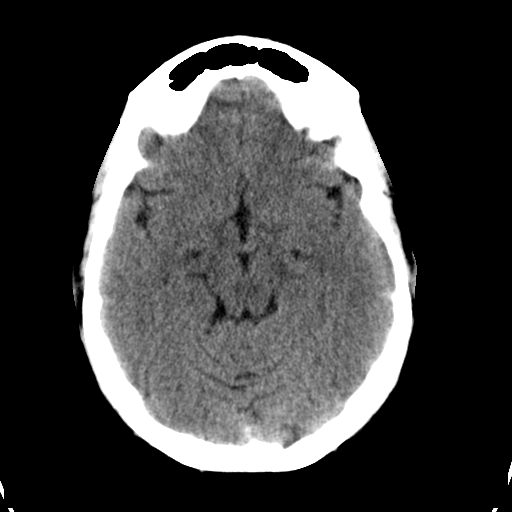
[im 15/36  brain]
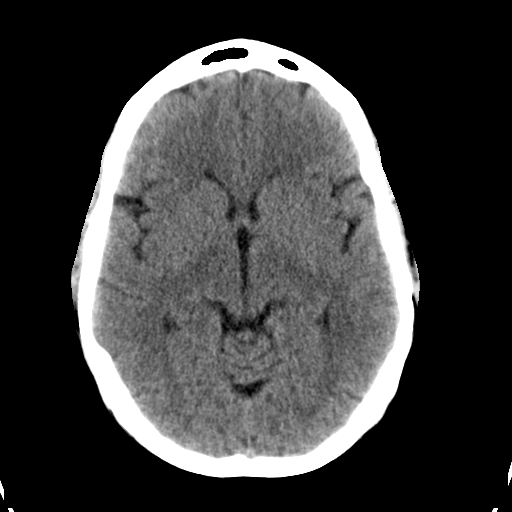
[im 17/36  brain]
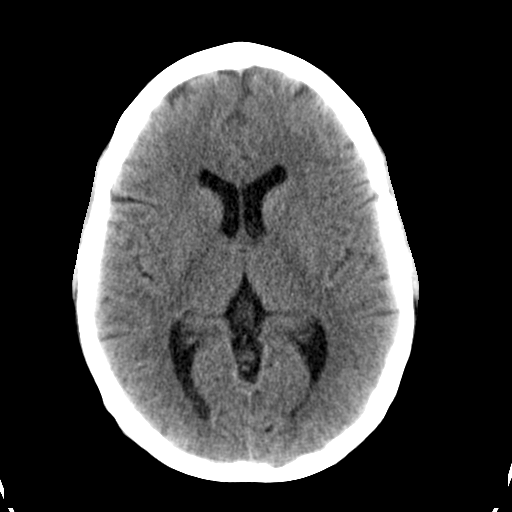
[im 19/36  brain]
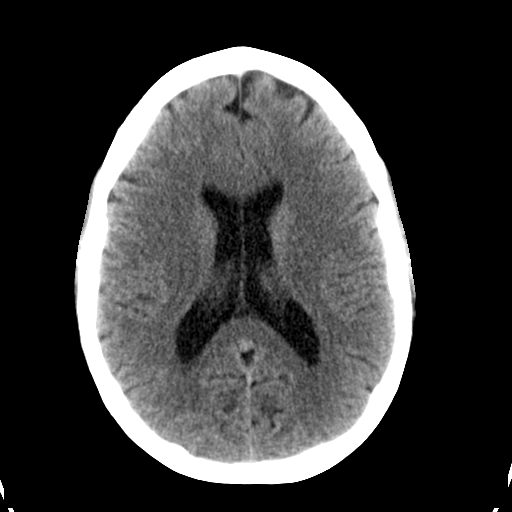
[im 19/36  bone]
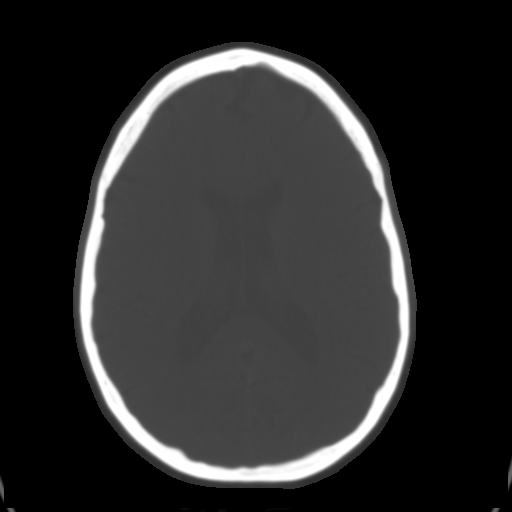
[im 21/36  brain]
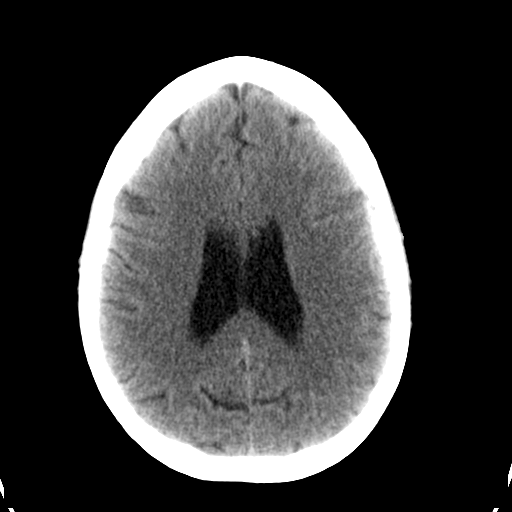
[im 23/36  brain]
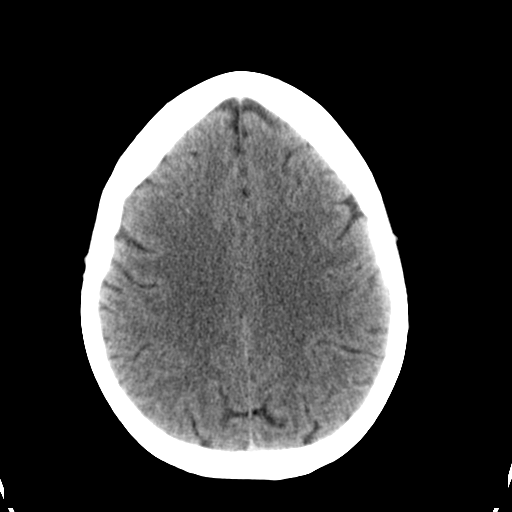
[im 26/36  brain]
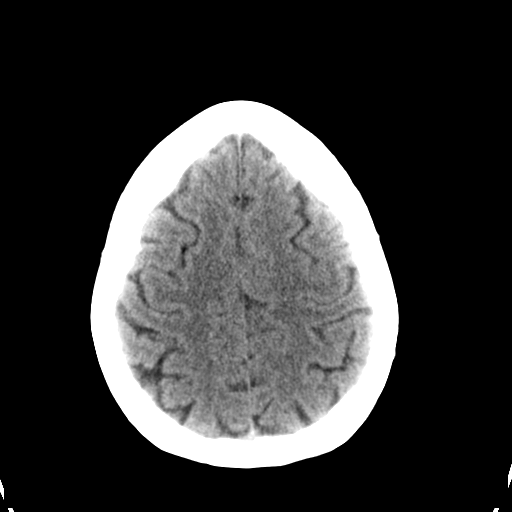
[im 27/36  brain]
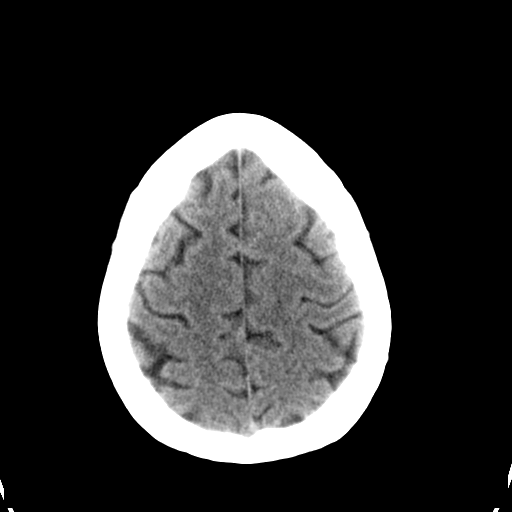
[im 27/36  bone]
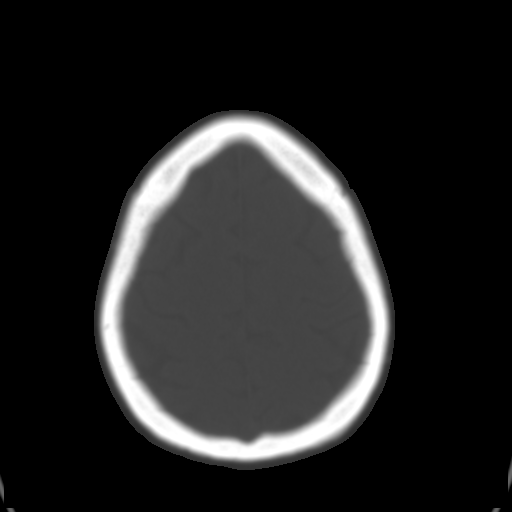
[im 29/36  brain]
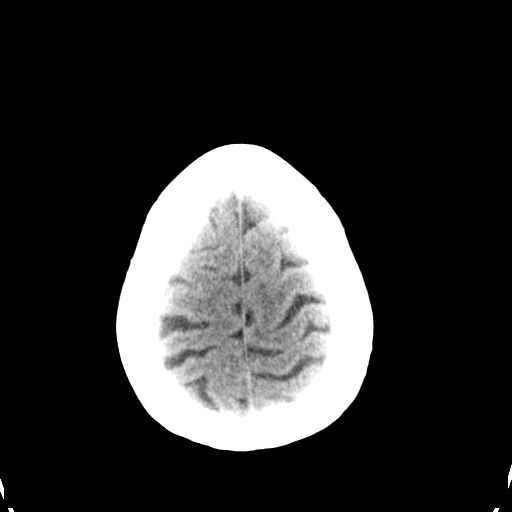
[im 32/36  brain]
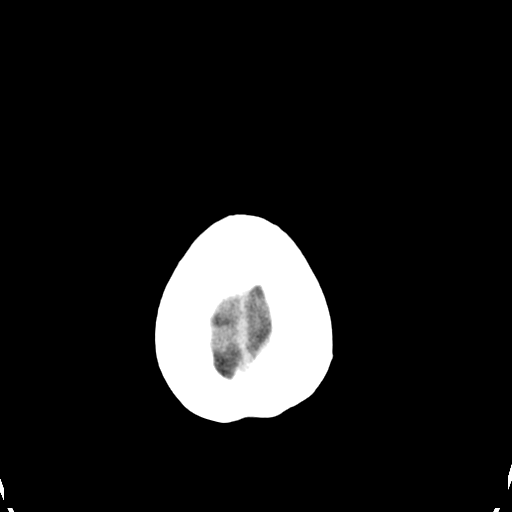
[im 34/36  brain]
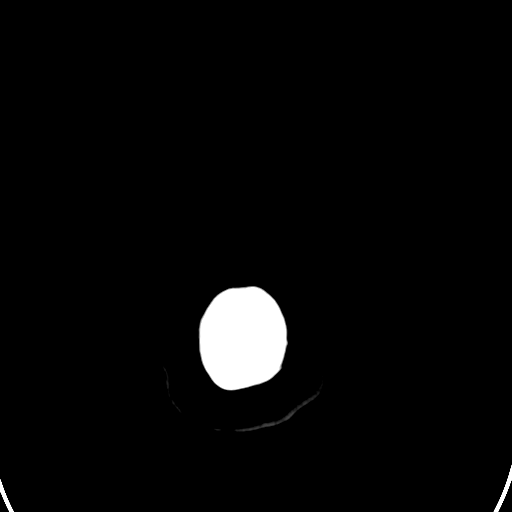

[16 of 30 positions shown; findings below may reference images not displayed]

FINDINGS: There is no evidence of mass effect, midline shift or extra-axial
fluid collections. There is no evidence of a space-occupying lesion
or intracranial hemorrhage. There is no evidence of a cortical-based
area of acute infarction.

The ventricles and sulci are appropriate for the patient's age. The
basal cisterns are patent.

Visualized portions of the orbits are unremarkable. The visualized
portions of the paranasal sinuses and mastoid air cells are
unremarkable.

The osseous structures are unremarkable.
IMPRESSION: Normal CT of the brain without intravenous contrast.

## 2014-10-16 ENCOUNTER — Telehealth: Payer: Self-pay | Admitting: Cardiology

## 2014-10-16 NOTE — Telephone Encounter (Signed)
I would increase further to 25 mg po BID.

## 2014-10-16 NOTE — Telephone Encounter (Signed)
Spoke with pt. She notes increased palpitations since Sunday night. No chest pain. Tired and with little energy. Had been feeling better on increased dose of Carvedilol until this past Sunday. States heart rate at rest is 70-80 and goes up to over 100 with any activity. Dizziness occasionally with sitting but worse when up walking. Blood pressure running 130's/80's.  Is taking Carvedilol 12. 5 mg by mouth twice daily. Will forward to Dr. Delton SeeNelson for recommendations.  (if after 5:00 today pt requests call back on cell number)

## 2014-10-16 NOTE — Telephone Encounter (Signed)
New message  Pt called with a question. She shares that over the last 3 days she has had more palpitations, dizziness and finds herself seldomly tired.. requests a call back to determine if she should go on Carvedilol.

## 2014-10-17 MED ORDER — CARVEDILOL 25 MG PO TABS: 25.0000 mg | ORAL_TABLET | Freq: Two times a day (BID) | ORAL | Status: DC

## 2014-10-17 NOTE — Telephone Encounter (Signed)
Informed the pt that per Dr Delton SeeNelson she would like to increase the pts Carvedilol to 25 mg po BID, for complaints mentioned.  Advised the pt to contact with feedback in about a week or so.  Confirmed pharmacy of choice with the pt.  Pt verbalized understanding and agrees with this plan.

## 2014-10-30 ENCOUNTER — Ambulatory Visit (INDEPENDENT_AMBULATORY_CARE_PROVIDER_SITE_OTHER): Payer: 59 | Admitting: Psychology

## 2014-10-30 DIAGNOSIS — F411 Generalized anxiety disorder: Secondary | ICD-10-CM

## 2014-11-18 ENCOUNTER — Ambulatory Visit: Payer: 59 | Admitting: Cardiology

## 2014-11-28 ENCOUNTER — Ambulatory Visit: Payer: 59 | Admitting: Psychology

## 2014-12-17 ENCOUNTER — Other Ambulatory Visit: Payer: Self-pay | Admitting: Family Medicine

## 2014-12-19 ENCOUNTER — Ambulatory Visit (INDEPENDENT_AMBULATORY_CARE_PROVIDER_SITE_OTHER): Payer: 59 | Admitting: Psychology

## 2014-12-19 DIAGNOSIS — F411 Generalized anxiety disorder: Secondary | ICD-10-CM

## 2014-12-23 ENCOUNTER — Ambulatory Visit: Payer: 59 | Admitting: Cardiology

## 2014-12-26 ENCOUNTER — Other Ambulatory Visit: Payer: Self-pay | Admitting: Family Medicine

## 2014-12-26 NOTE — Telephone Encounter (Signed)
Please ask pt. What medication is for. She is on Clonopin and should review with psychiatry.  Otherwise, she will need an appointment for refill.

## 2014-12-27 ENCOUNTER — Other Ambulatory Visit: Payer: Self-pay | Admitting: Family Medicine

## 2014-12-27 NOTE — Telephone Encounter (Signed)
Original request was sent to Dr. Shawnie PonsPratt in error.  Should have been to Dr. Lum BabeEniola.  Please send refill to Outpt pharmacy.  Pt out of medication altogether.

## 2014-12-28 NOTE — Telephone Encounter (Signed)
I never prescribed valium to patient she must have be getting it from her psychiatrist. Please have her contact her Psyhc.

## 2014-12-30 NOTE — Telephone Encounter (Signed)
Left voice message for patient to call her psychiatrist for medication refill for Valium.  Pt to follow with PCP as needed.  Clovis PuMartin, Tamika L, RN

## 2014-12-31 NOTE — Telephone Encounter (Signed)
Pt calling back about valium rx, says Dr. Shawnie PonsPratt wrote valium rx on 06/24/14 and gave one refill, pt says rx lasted from Aug to Oct then she got the one refill which lasted until now. Pt says she only takes this for vertigo when the other medication does not work. Pt says her psychiatrist would not prescribe this since it is for vertigo.

## 2014-12-31 NOTE — Telephone Encounter (Signed)
Per Dr. Lum BabeEniola will send in meclizine if she would like. Advised pt of this and she stated that she already has meclizine and that psychiatry never filled this med and that we have been filling it for her. She stated that she does not use Valium often as she has used last prescription over a 4 month period. Please advise. Cyndie MullLatoya Fancy Dunkley, CMA.

## 2014-12-31 NOTE — Addendum Note (Signed)
Addended by: Janit PaganENIOLA, Aujanae Mccullum T on: 12/31/2014 11:55 AM   Modules accepted: Orders, Medications

## 2014-12-31 NOTE — Telephone Encounter (Signed)
See phone note. Wladyslawa Disbro,CMA

## 2014-12-31 NOTE — Telephone Encounter (Signed)
I just spoke with patient, I explained to her that she is already on Clonazepam which is almost the same medicine categorically with Diazepam and I don't want her to be on both. I will not recommend valium for her vertigo, she may continue Meclizine. She agreed with plan. She still has Meclizine at home, no refill needed for now.

## 2015-01-13 ENCOUNTER — Ambulatory Visit: Payer: 59 | Admitting: Cardiology

## 2015-02-04 ENCOUNTER — Ambulatory Visit: Payer: 59

## 2015-02-25 ENCOUNTER — Other Ambulatory Visit: Payer: Self-pay

## 2015-02-25 VITALS — BP 128/86 | HR 85 | Ht 66.0 in | Wt 345.4 lb

## 2015-02-25 DIAGNOSIS — R7303 Prediabetes: Secondary | ICD-10-CM

## 2015-02-25 NOTE — Patient Outreach (Signed)
[facility]) Care Management   02/25/2015  Amanda Massey 1981/09/09 [id number]  Amanda Massey is an 34 y.o. female.   Member seen for follow up office visit for Link to Wellness program for self management of prediabetes.  Subjective: Member states that she has been busy caring for her daughter who has been dx with irritable bowel syndrome.  States she has not made herself a priority.  States her dizziness is better now that she is taking the Coreg.  States she has not been exercising.    Objective:   Review of Systems  All other systems reviewed and are negative.   Physical Exam  Filed Vitals:   02/25/15 1602  BP: 128/86  Pulse: 85   Filed Weights   02/25/15 1602  Weight: 345 lb 6.4 oz (156.672 kg)    Current Medications:   Current Outpatient Prescriptions  Medication Sig Dispense Refill  . ADVAIR DISKUS 100-50 MCG/DOSE AEPB INHALE 1 PUFF BY MOUTH TWICE DAILY 60 each 5  . albuterol (VENTOLIN HFA) 108 (90 BASE) MCG/ACT inhaler Inhale 2 puffs into the lungs every 4 (four) hours as needed for wheezing or shortness of breath. 1 each 4  . b complex vitamins tablet Take 2 tablets by mouth daily.     . busPIRone (BUSPAR) 30 MG tablet Take 30 mg by mouth 2 (two) times daily.    . carvedilol (COREG) 25 MG tablet Take 1 tablet (25 mg total) by mouth 2 (two) times daily with a meal. 180 tablet 3  . Cholecalciferol (VITAMIN D3) 5000 UNITS TABS Take 1 tablet by mouth every morning.    . citalopram (CELEXA) 20 MG tablet Take 20 mg by mouth 2 (two) times daily.    . clonazePAM (KLONOPIN) 1 MG tablet Take 1 mg by mouth 3 (three) times daily.     Marland Kitchen. ketotifen (ZADITOR) 0.025 % ophthalmic solution 1 drop 2 (two) times daily.    . meclizine (ANTIVERT) 25 MG tablet Take 25 mg by mouth 3 (three) times daily as needed for dizziness.    . metFORMIN (GLUCOPHAGE) 500 MG tablet Take 2 tablets (1,000 mg total) by mouth 2 (two) times daily with a meal. 120 tablet 5  .  norethindrone-ethinyl estradiol-iron (ESTROSTEP FE,TILIA FE,TRI-LEGEST FE) 1-20/1-30/1-35 MG-MCG tablet Take 1 tablet by mouth daily.    . promethazine (PHENERGAN) 25 MG tablet Take 1 tablet (25 mg total) by mouth every 6 (six) hours as needed for nausea. 42 tablet 2   No current facility-administered medications for this visit.    Functional Status:   In your present state of health, do you have any difficulty performing the following activities: 02/25/2015  Hearing? N  Vision? N  Difficulty concentrating or making decisions? N  Walking or climbing stairs? N  Dressing or bathing? N  Doing errands, shopping? N    Fall/Depression Screening:    PHQ 2/9 Scores 02/25/2015 04/15/2014  PHQ - 2 Score 0 0   [facility] CM Care Plan        Patient Outreach from 02/25/2015 in [facility] Link To Wellness   Care Plan Problem One  Potential for elevated blood sugars related to dx of prediabetes   Care Plan for Problem One  Active   Interventions for Problem One Long Term Goal  Reviewed CHO counting and portion control, instructed on importance of regular exercise for glycemic control, Discussed weight loss and  the option of having bariatric surgery, instructed on how to sigh up  for the  information classes   [facility] Long Term Goal (31-90 days)  Mmeber will maintain hemoglobin A1C at or below 6.5   [facility] Long Term Goal Start Date  02/25/15   [facility] CM Short Term Goal #1 (0-30 days)  Member will complete EMMI programs by 03/28/15   [facility] CM Short Term Goal #1 Start Date  02/25/15      Assessment:  Member has not been exercising regularly.  She has been trying to limit CHO in diet but she has not lost any weight.  She voices that she is interested in looking into bariatric surgery.  She has not been checking blood sugars.  Her last hemoglobin A1C  Plan: Plan to return to Link to Wellness on 08/26/15. Dudley Major RN, [facility] Care Management [phone]

## 2015-02-26 NOTE — Patient Instructions (Addendum)
1. Plan to eat 30-45 GM (2-3) servings of carbohydrate a meal and 15 GM for snacks. Try to limit sweets and sodas 2. Plan to ride recumbent bike twice a week for 15 minutes.  Plan to work up to 150 minutes a week. 3. Plan to go to bariatric class or do online class 4. Plan to complete EMMI programs by 03/28/15 5. Plan to call to follow up with dietitian 6. Plan to return to Link to Wellness on 08/26/15 at 4 PM

## 2015-04-29 ENCOUNTER — Other Ambulatory Visit: Payer: Self-pay | Admitting: Family Medicine

## 2015-05-20 ENCOUNTER — Other Ambulatory Visit: Payer: Self-pay | Admitting: Internal Medicine

## 2015-05-26 ENCOUNTER — Encounter: Payer: Self-pay | Admitting: Family Medicine

## 2015-05-26 ENCOUNTER — Ambulatory Visit (INDEPENDENT_AMBULATORY_CARE_PROVIDER_SITE_OTHER): Payer: 59 | Admitting: Family Medicine

## 2015-05-26 VITALS — BP 151/90 | HR 97 | Temp 98.9°F | Wt 349.0 lb

## 2015-05-26 DIAGNOSIS — R197 Diarrhea, unspecified: Secondary | ICD-10-CM | POA: Insufficient documentation

## 2015-05-26 NOTE — Progress Notes (Signed)
SDA Complaint  Problem began 7 days ago Progression: itching of arms and diarrhea >> then to legs, head, and back >> now nauseated. Medications tried: phenergan (nausea) Anything improved it: phenergan Anything worsen it: none Had similar problem before: no  DIARRHEA  Having diarrhea for 7 days Progression: watery Stools per day: 4-5 Does diarrhea wake patient: no Recent travel: no Sick contacts: no Ingested suspicious foods: no Antibiotics recently: no Immunocompromised: no  Symptoms Vomiting: no Abdominal pain: discomfort/nausea Weight Loss: no Decreased urine output: no Lightheadedness: no Fever: no Bloody stools: no  ROS see HPI Smoking Status noted  Objective: BP 151/90 mmHg  Pulse 97  Temp(Src) 98.9 F (37.2 C) (Oral)  Wt 349 lb (158.305 kg)  LMP 05/11/2015 Gen: NAD, alert, cooperative, and pleasant. HEENT: NCAT, EOMI, PERRL CV: RRR, no murmur Abd: SNTND, BS present, no guarding or organomegaly Ext: No edema, warm, maculopapular rash over her forearms bilaterally Neuro: Alert and oriented, Speech clear, No gross deficits  Assessment and plan:  Diarrhea Patient's symptoms most consistent w/ viral enteritis. Patient states she is having no issues w/ vomiting, but nausea is uncomfortable. She is eating/drinking w/o much problem. Viral process is most likely due to the multi-system involvement present, however patient has afebrile. Viral etiology could potentially explain the diarrhea, nausea, and skin rash. A bacterial cause could be at play. Another possibility is a rheumatologic or allergic process. Patient denied any travel, new exposures, or history of allergies.  - If viral process is the cause, patient's symptoms should be self-limited and will lessen over the next week or so. - I instructed patient to take ibuprofen/tylenol for symptom/pain control for any fevers, or aches/pains.  - patient is to schedule a f/u if sxs persist or worsen    Kathee DeltonIan D  Madina Galati, MD,MS,  PGY2 05/26/2015 7:06 PM

## 2015-05-26 NOTE — Assessment & Plan Note (Signed)
Patient's symptoms most consistent w/ viral enteritis. Patient states she is having no issues w/ vomiting, but nausea is uncomfortable. She is eating/drinking w/o much problem. Viral process is most likely due to the multi-system involvement present, however patient has afebrile. Viral etiology could potentially explain the diarrhea, nausea, and skin rash. A bacterial cause could be at play. Another possibility is a rheumatologic or allergic process. Patient denied any travel, new exposures, or history of allergies.  - If viral process is the cause, patient's symptoms should be self-limited and will lessen over the next week or so. - I instructed patient to take ibuprofen/tylenol for symptom/pain control for any fevers, or aches/pains.  - patient is to schedule a f/u if sxs persist or worsen

## 2015-05-26 NOTE — Patient Instructions (Signed)
It was a pleasure seeing you today in our clinic. Today we discussed your rash, nausea, and diarrhea. Here is the treatment plan we have discussed and agreed upon together:   - It is my strong belief that you have a viral infection. This should improved with some time. The important thing is to keep your fluids up but drinking plenty of water (a goal of having clear urine).  - If you experience any fevers, or body aches you can take tylenol or ibuprofen.  - If your symptoms do not improve over the next week, or your symptoms worsen then please schedule another appointment with us.

## 2015-05-27 NOTE — Progress Notes (Signed)
I was preceptor the day of this visit.   

## 2015-06-17 ENCOUNTER — Encounter: Payer: Self-pay | Admitting: Internal Medicine

## 2015-06-17 ENCOUNTER — Ambulatory Visit (INDEPENDENT_AMBULATORY_CARE_PROVIDER_SITE_OTHER): Payer: 59 | Admitting: Internal Medicine

## 2015-06-17 VITALS — BP 118/62 | HR 100 | Temp 97.8°F | Resp 12 | Wt 346.0 lb

## 2015-06-17 DIAGNOSIS — R7309 Other abnormal glucose: Secondary | ICD-10-CM

## 2015-06-17 DIAGNOSIS — R946 Abnormal results of thyroid function studies: Secondary | ICD-10-CM

## 2015-06-17 DIAGNOSIS — R7989 Other specified abnormal findings of blood chemistry: Secondary | ICD-10-CM | POA: Insufficient documentation

## 2015-06-17 DIAGNOSIS — E282 Polycystic ovarian syndrome: Secondary | ICD-10-CM

## 2015-06-17 DIAGNOSIS — R7303 Prediabetes: Secondary | ICD-10-CM

## 2015-06-17 HISTORY — DX: Other specified abnormal findings of blood chemistry: R79.89

## 2015-06-17 LAB — TSH: TSH: 4.76 u[IU]/mL — ABNORMAL HIGH (ref 0.35–4.50)

## 2015-06-17 LAB — HEMOGLOBIN A1C: Hgb A1c MFr Bld: 5.6 % (ref 4.6–6.5)

## 2015-06-17 MED ORDER — METFORMIN HCL ER 500 MG PO TB24: ORAL_TABLET | ORAL | Status: DC

## 2015-06-17 NOTE — Progress Notes (Signed)
Patient ID: Amanda Massey, female   DOB: 07-20-1981, 34 y.o.   MRN: [id number]  HPI: Amanda Massey is a 34 y.o.-year-old female, initially referred by her nutritionist, Amanda Massey for management of PCOS and prediabetes. Last visit 9 mo ago. ObGyn: Dr. Adalberto Ill.  She had vertigo and PVCs in 10/2014 >> saw cardiology as she also had palpitations >> Holter monitor, 2DEcho >> started Carvedilol >> sxs resolved.  She and her husband consider having another baby >> but she wants to lose weight first.  She was dx with PCOS in 2009.  Reviewed hx: Fertility/Menstrual cycles: - menarche at 34 y/o - on OCP at 34 y/o >> then came off OCPs at 34 y/o - irregular menses after coming off OCP >> 3x a year - it took her 2 years to get pregnant, used Clomid - saw Dr Amanda Massey >> tried many IUI tx's, other procedures except IVF >> did not work - She would like to get showed left to get pregnant again, but she realizes this is very low chance - + h/o ovarian cysts - children: 1, but tried for another one  - miscarriages: 1 - lost baby at 8 weeks in 2013 - contraception: OCPs now - LoLoestrin - started it 6 mo - likes it (25$ a mo)  Weight gain: - stayed stable for most of her adult life - 2 years ago lost 60 lbs over 6 mo - diet (low carb) and exercise  - no steroid use, other than Prednisone for URI - no weight loss meds - working with nutrition, Amanda Massey - Meals: - Breakfast: she usually skips it, but if she eats then cereal or eggs/cheese biscuit - Lunch: sandwich - Dinner: some fast food - Snacks: very rare usually not Drinks: no soft drinks every day, mostly water or milk - Diets tried: decreased portions, less starches - Exercise: not consistently  Acne: - no  Hirsutism: - upper lip/neck/chin - is using hair removal creams - also shaving  Treatments tried: - on Metformin - did not try Spironolactone - did not try [place] - on OCPs ([facility])  Last testosterone -  high on LoLoEstrin: Component     Latest Ref Rng 09/23/2014  Testosterone     10 - 70 ng/dL 71 (H)  Sex Hormone Binding     18 - 114 nmol/L 55  Testosterone Free     0.6 - 6.8 pg/mL 9.2 (H)  Testosterone-% Free     0.4 - 2.4 % 1.3   - Last thyroid tests: Lab Results  Component Value Date   TSH 2.71 08/16/2014   FREET4 1.03 08/16/2014   - Last set of lipids:    Component Value Date/Time   CHOL 156 09/23/2014 1410   TRIG 173.0* 09/23/2014 1410   HDL 37.40* 09/23/2014 1410   CHOLHDL 4 09/23/2014 1410   VLDL 34.6 09/23/2014 1410   LDLCALC 84 09/23/2014 1410   - Last HbA1c: Lab Results  Component Value Date   HGBA1C 5.8 09/23/2014   - last CMP:   Chemistry      Component Value Date/Time   NA 140 08/10/2014 2238   NA 140 07/04/2014 1439   K 3.4* 08/10/2014 2238   K 4.3 07/04/2014 1439   CL 104 08/10/2014 2238   CL 103 07/04/2014 1439   CO2 29 08/10/2014 2238   CO2 27 07/04/2014 1439   BUN 6* 08/10/2014 2238   BUN 10 07/04/2014 1439   CREATININE 0.84 08/10/2014 2238  CREATININE 0.76 07/04/2014 1439   CREATININE 0.77 08/08/2013 1905      Component Value Date/Time   CALCIUM 9.1 08/10/2014 2238   CALCIUM 9.2 07/04/2014 1439   ALKPHOS 60 09/23/2014 1410   AST 21 09/23/2014 1410   ALT 22 09/23/2014 1410   BILITOT 0.4 09/23/2014 1410     Prediabetes: Last hemoglobin A1c was: Lab Results  Component Value Date   HGBA1C 5.8 09/23/2014   HGBA1C 5.6 04/25/2013   HGBA1C 5.5 12/07/2012   Pt is on a regimen of: - Metformin XR 1000 mg bid She was on [place] in the past.  No FH of DM; she has a h/o GDM in 2003.   ROS: Constitutional: + weight gain, + fatigue, + subjective hyperthermia Eyes: no blurry vision, no xerophthalmia ENT: no sore throat, no nodules palpated in throat, no dysphagia/odynophagia, no hoarseness Cardiovascular: no CP/SOB/+ palpitations - now better on Coreg/no leg swelling Respiratory: no cough/SOB Gastrointestinal: no N/V/+ D/no  C Musculoskeletal: no muscle/joint aches Skin: no acne, + hair on face Neurological: no tremors/numbness/tingling/dizziness  I reviewed pt's medications, allergies, PMH, social hx, family hx, and changes were documented in the history of present illness. Otherwise, unchanged from my initial visit note:  Past Medical History  Diagnosis Date  . PCOS (polycystic ovarian syndrome)   . Anxiety   . Asthma   . Vertigo   . Prediabetes   . Obesity   . Hypertension     no rx meds  . Seasonal allergies   . Back pain     tx with OTC meds   Past Surgical History  Procedure Laterality Date  . Wisdom tooth extraction    . Multiple tooth extractions      for braces  . Svd      x 1  . Dilation and evacuation  03/17/2012    Procedure: DILATATION AND EVACUATION;  Surgeon: Jeani Hawking, MD;  Location: [facility];  Service: Gynecology;  Laterality: N/A;   History   Social History  . Marital Status: Married    Spouse Name: N/A    Number of Children: 1   Occupational History  . 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 Smoking status: Never Smoker   . Smokeless tobacco: Never Used  . Alcohol Use: No  . Drug Use: No   Social History Narrative   Current Outpatient Prescriptions on File Prior to Visit  Medication Sig Dispense Refill  . ADVAIR DISKUS 100-50 MCG/DOSE AEPB INHALE 1 PUFF BY MOUTH TWICE DAILY 60 each 5  . b complex vitamins tablet Take 2 tablets by mouth daily.     . busPIRone (BUSPAR) 30 MG tablet Take 30 mg by mouth 2 (two) times daily.    . carvedilol (COREG) 25 MG tablet Take 1 tablet (25 mg total) by mouth 2 (two) times daily with a meal. 180 tablet 3  . Cholecalciferol (VITAMIN D3) 5000 UNITS TABS Take 1 tablet by mouth every morning.    . citalopram (CELEXA) 20 MG tablet Take 20 mg by mouth 2 (two) times daily.    . clonazePAM (KLONOPIN) 1 MG tablet Take 1 mg by mouth 3 (three) times daily.     Marland Kitchen ketotifen (ZADITOR) 0.025 %  ophthalmic solution 1 drop 2 (two) times daily.    . meclizine (ANTIVERT) 25 MG tablet Take 25 mg by mouth 3 (three) times daily as needed for dizziness.    . metFORMIN (GLUCOPHAGE)  500 MG tablet Take 2 tablets (1,000 mg total) by mouth 2 (two) times daily with a meal. 120 tablet 5  . metFORMIN (GLUCOPHAGE-XR) 500 MG 24 hr tablet TAKE 2 TABLETS BY MOUTH TWICE DAILY WITH A MEAL 60 tablet 0  . norethindrone-ethinyl estradiol-iron (ESTROSTEP FE,TILIA FE,TRI-LEGEST FE) 1-20/1-30/1-35 MG-MCG tablet Take 1 tablet by mouth daily.    . promethazine (PHENERGAN) 25 MG tablet Take 1 tablet (25 mg total) by mouth every 6 (six) hours as needed for nausea. 42 tablet 2  . VENTOLIN HFA 108 (90 BASE) MCG/ACT inhaler INHALE 2 PUFFS INTO THE LUNGS EVERY 4 HOURS AS NEEDED FOR WHEEZING OR SHORTNESS OF BREATH. 18 g 4   No current facility-administered medications on file prior to visit.   No Known Allergies Family History  Problem Relation Age of Onset  . Parkinsonism Father   . Cancer Neg Hx   . Alcohol abuse Neg Hx   . Depression Neg Hx   . Diabetes Neg Hx   . Hypertension Neg Hx   . Hyperlipidemia Neg Hx   . Heart attack Paternal Grandfather    PE: BP 118/62 mmHg  Pulse 100  Temp(Src) 97.8 F (36.6 C) (Oral)  Resp 12  Wt 346 lb (156.945 kg)  SpO2 97%  LMP 05/11/2015 Wt Readings from Last 3 Encounters:  06/17/15 346 lb (156.945 kg)  05/26/15 349 lb (158.305 kg)  02/25/15 345 lb 6.4 oz (156.672 kg)   Constitutional: obesity class III, in NAD, no full supraclavicular fat pads Eyes: PERRLA, EOMI, no exophthalmos ENT: moist mucous membranes, no thyromegaly, no cervical lymphadenopathy Cardiovascular: tachycardia, RR, No MRG Respiratory: CTA B Gastrointestinal: abdomen soft, NT, ND, BS+ Musculoskeletal: no deformities, strength intact in all 4 Skin: moist, warm, no rashes, + mild acanthosis nigricans on neck, + slightly darker terminal hairs on chin Neurological: no tremor with outstretched hands,  DTR normal in all 4  ASSESSMENT: 1. PCOSs  2. DM2, non-insulin-dependent, uncontrolled, without complication  3. Obesity class III BMI Classification:  < 18.5 underweight   18.5-24.9 normal weight   25.0-29.9 overweight   30.0-34.9 class I obesity   35.0-39.9 class II obesity   ? 40.0 class III obesity   PLAN:  1. PCOS - doing well on OCPs, but at last visit, her free testosterone was >ULN >> we increased Metformin (to help her more with losing weight) >> will recheck at this visit >> if still high >> may need to switch to OrtoCyclen - we have discussed about the addition of spironolactone (however she is not very much bothered by her extra hair on chin, especially since this is blond).  - unfortunately, she gained weight since last visit... She is trying to lose to be able to retry getting pregnant  2. Patient with borderline prediabetes - Latest hemoglobin A1c level slightly above LLN for prediabetes >> we increased Metformin to 1000 mg bid >> tolerates this well - continue current dose of Metformin - We will recheck her hemoglobin A1c  3. Obesity class 3 - check TSH today  Orders Placed This Encounter  Procedures  . Testosterone, Free, Total, SHBG  . HgB A1c  . TSH   Office Visit on 06/17/2015  Component Date Value Ref Range Status  . Testosterone 06/17/2015 61  10 - 70 ng/dL Final   Comment:           Tanner Stage       Female  Female               I              < 30 ng/dL        < 10 ng/dL               II             < 150 ng/dL       < 30 ng/dL               III            100-320 ng/dL     < 35 ng/dL               IV             200-970 ng/dL     16-10 ng/dL               V/Adult        300-890 ng/dL     96-04 ng/dL     . Sex Hormone Binding 06/17/2015 36  17 - 124 nmol/L Final  . Testosterone, Free 06/17/2015 10.5* 0.6 - 6.8 pg/mL Final   Comment:   The concentration of free testosterone is derived from a mathematical expression based on  constants for the binding of testosterone to sex hormone-binding globulin and albumin.   . Testosterone-% Free 06/17/2015 1.7  0.4 - 2.4 % Final  . Hgb A1c MFr Bld 06/17/2015 5.6  4.6 - 6.5 % Final   Glycemic Control Guidelines for People with Diabetes:Non Diabetic:  <6%Goal of Therapy: <7%Additional Action Suggested:  >8%   . TSH 06/17/2015 4.76* 0.35 - 4.50 uIU/mL Final  Hemoglobin A1c has improved, as expected. TSH is slightly high, which is not unusual in the settings of obesity class III.  I would like to repeat thyroid tests in 2 months. Free testosterone is still high >> will recommend Ortho-Cyclen.

## 2015-06-18 LAB — TESTOSTERONE, FREE, TOTAL, SHBG
Sex Hormone Binding: 36 nmol/L (ref 17–124)
TESTOSTERONE FREE: 10.5 pg/mL — AB (ref 0.6–6.8)
TESTOSTERONE: 61 ng/dL (ref 10–70)
Testosterone-% Free: 1.7 % (ref 0.4–2.4)

## 2015-06-19 MED ORDER — NORGESTIMATE-ETH ESTRADIOL 0.25-35 MG-MCG PO TABS: 1.0000 | ORAL_TABLET | Freq: Every day | ORAL | Status: DC

## 2015-06-25 ENCOUNTER — Encounter: Payer: Self-pay | Admitting: Family Medicine

## 2015-06-26 ENCOUNTER — Encounter: Payer: Self-pay | Admitting: Family Medicine

## 2015-06-26 NOTE — Telephone Encounter (Signed)
Will forward to PCP for review. Joyice Magda, CMA. 

## 2015-07-11 ENCOUNTER — Ambulatory Visit: Payer: Self-pay | Admitting: Family Medicine

## 2015-08-26 ENCOUNTER — Other Ambulatory Visit: Payer: Self-pay

## 2015-08-27 NOTE — Patient Outreach (Signed)
[facility] Care Management  08/27/2015  Amanda PiccoloDeborah L Massey 03-22-1981 [id number]   Case closed.  Member notified [facility] that she is no longer employed by [employer] and no longer has the [employer] insurance. Dudley MajorMelissa [name] RN, [facility],CCM Care Management Coordinator-[facility] to Wellness [facility] Care Management [phone]

## 2015-11-17 ENCOUNTER — Other Ambulatory Visit: Payer: Self-pay | Admitting: Cardiology

## 2015-11-28 MED FILL — CARVEDILOL 25 MG TABLET: 25 | 30 days supply | Qty: 60 | Fill #0

## 2015-11-30 ENCOUNTER — Ambulatory Visit (INDEPENDENT_AMBULATORY_CARE_PROVIDER_SITE_OTHER): Payer: 59 | Admitting: Family Medicine

## 2015-11-30 VITALS — BP 134/82 | HR 118 | Temp 97.8°F | Resp 18 | Ht 67.0 in | Wt 353.0 lb

## 2015-11-30 DIAGNOSIS — H6121 Impacted cerumen, right ear: Secondary | ICD-10-CM | POA: Diagnosis not present

## 2015-11-30 DIAGNOSIS — R05 Cough: Secondary | ICD-10-CM | POA: Diagnosis not present

## 2015-11-30 DIAGNOSIS — J069 Acute upper respiratory infection, unspecified: Secondary | ICD-10-CM

## 2015-11-30 DIAGNOSIS — R300 Dysuria: Secondary | ICD-10-CM | POA: Diagnosis not present

## 2015-11-30 DIAGNOSIS — J0100 Acute maxillary sinusitis, unspecified: Secondary | ICD-10-CM

## 2015-11-30 DIAGNOSIS — R059 Cough, unspecified: Secondary | ICD-10-CM

## 2015-11-30 LAB — POCT URINALYSIS DIP (MANUAL ENTRY)
Blood, UA: NEGATIVE
GLUCOSE UA: NEGATIVE
Nitrite, UA: NEGATIVE
Spec Grav, UA: >=1.03
Urobilinogen, UA: 0.2
pH, UA: 5.5

## 2015-11-30 LAB — POC MICROSCOPIC URINALYSIS (UMFC): Mucus: ABSENT

## 2015-11-30 MED ORDER — LEVOFLOXACIN 500 MG PO TABS: 500.0000 mg | ORAL_TABLET | Freq: Every day | ORAL | Status: DC

## 2015-11-30 MED ORDER — HYDROCODONE-HOMATROPINE 5-1.5 MG/5ML PO SYRP: 5.0000 mL | ORAL_SOLUTION | Freq: Three times a day (TID) | ORAL | Status: DC | PRN

## 2015-11-30 NOTE — Progress Notes (Signed)
This chart was scribed for Amanda Sidle, MD by [facility], medical scribe at [facility].The patient was seen in exam room 12 and the patient's care was started at 1:20 PM.  Patient ID: Amanda Massey MRN: [id number], DOB: Jan 01, 1981, 35 y.o. Date of Encounter: 11/30/2015  Primary Physician: Janit Pagan, MD  Chief Complaint:  Chief Complaint  Patient presents with  . Nasal Congestion    since wednesday   . Sore Throat  . Headache  . Dysuria    with odor x2 days   . Fatigue   HPI:  Amanda Massey is a 35 y.o. female who presents to [facility] with multiple complaints. She has had congestion, SOB, sore throat and fatigue for the past four days. She has been taking mucinex, also using an inhaler for relief. Concerned she may develop a UTI. She currently has dysuria with an odor and vaginal itching for the past two days.  Past Medical History  Diagnosis Date  . PCOS (polycystic ovarian syndrome)   . Anxiety   . Asthma   . Vertigo   . Prediabetes   . Obesity   . Hypertension     no rx meds  . Seasonal allergies   . Back pain     tx with OTC meds  . Allergy   . Depression     Home Meds: Prior to Admission medications   Medication Sig Start Date End Date Taking? Authorizing Provider  ADVAIR DISKUS 100-50 MCG/DOSE AEPB INHALE 1 PUFF BY MOUTH TWICE DAILY 12/17/14  Yes Doreene Eland, MD  b complex vitamins tablet Take 2 tablets by mouth daily.    Yes Historical Provider, MD  busPIRone (BUSPAR) 30 MG tablet Take 30 mg by mouth 2 (two) times daily.   Yes Historical Provider, MD  carvedilol (COREG) 25 MG tablet TAKE 1 TABLET BY MOUTH 2 TIMES DAILY WITH A MEAL. 11/19/15  Yes Lars Masson, MD  Cholecalciferol (VITAMIN D3) 5000 UNITS TABS Take 1 tablet by mouth every morning.   Yes Historical Provider, MD  citalopram (CELEXA) 20 MG tablet Take 20 mg by mouth 2 (two) times daily.   Yes Historical Provider, MD  clonazePAM  (KLONOPIN) 1 MG tablet Take 1 mg by mouth 3 (three) times daily.    Yes Historical Provider, MD  ketotifen (ZADITOR) 0.025 % ophthalmic solution 1 drop 2 (two) times daily.   Yes Historical Provider, MD  meclizine (ANTIVERT) 25 MG tablet Take 25 mg by mouth 3 (three) times daily as needed for dizziness.   Yes Historical Provider, MD  metFORMIN (GLUCOPHAGE-XR) 500 MG 24 hr tablet TAKE 2 TABLETS BY MOUTH TWICE DAILY WITH A MEAL 06/17/15  Yes Carlus Pavlov, MD  norgestimate-ethinyl estradiol (ORTHO-CYCLEN, 28,) 0.25-35 MG-MCG tablet Take 1 tablet by mouth daily. 06/19/15  Yes Carlus Pavlov, MD  promethazine (PHENERGAN) 25 MG tablet Take 1 tablet (25 mg total) by mouth every 6 (six) hours as needed for nausea. 06/24/14  Yes Reva Bores, MD  VENTOLIN HFA 108 (90 BASE) MCG/ACT inhaler INHALE 2 PUFFS INTO THE LUNGS EVERY 4 HOURS AS NEEDED FOR WHEEZING OR SHORTNESS OF BREATH. 04/29/15  Yes Doreene Eland, MD  cetirizine (ZYRTEC) 10 MG tablet Take 10 mg by mouth daily. Reported on 11/30/2015    Historical Provider, MD   Allergies: No Known Allergies  Social History   Social History  . Marital Status: Married    Spouse Name: N/A  .  Number of Children: N/A  . Years of Education: N/A   Occupational History  . Not on file.   Social History Main Topics  . Smoking status: Never Smoker   . Smokeless tobacco: Never Used  . Alcohol Use: No  . Drug Use: No  . Sexual Activity: Yes    Birth Control/ Protection: Pill     Comment: Pregnant approx 8 wks   Other Topics Concern  . Not on file   Social History Narrative    Review of Systems: Constitutional: negative for chills, fever, night sweats, weight changes. Positive for fatigue. HEENT: negative for vision changes, hearing loss, rhinorrhea, epistaxis, or sinus pressure. Positive for sore throat an congestion. Cardiovascular: negative for chest pain or palpitations Respiratory: negative for hemoptysis, wheezing. Positive for wheezing and  SOB. Abdominal: negative for abdominal pain, nausea, vomiting, diarrhea, or constipation Dermatological: negative for rash Genitourinary: Positive for dysuria and vaginal itching. Neurologic: negative for headache, dizziness, or syncope All other systems reviewed and are otherwise negative with the exception to those above and in the HPI.  Physical Exam: Blood pressure 134/82, pulse 118, temperature 97.8 F (36.6 C), temperature source Oral, resp. rate 18, height 5\' 7"  (1.702 m), weight 353 lb (160.12 kg), last menstrual period 11/20/2015, SpO2 97 %., Body mass index is 55.27 kg/(m^2). General: Well developed, well nourished, in no acute distress. Morbidly obese. Head: Normocephalic, atraumatic, eyes without discharge, sclera non-icteric, nares are without discharge. Mild erythema of the posterior oropharynx. Right ear is impacted with cerumen. Purulent mucosal discharge from nares.  Neck: Supple. No thyromegaly. Full ROM. No lymphadenopathy. Lungs: Clear bilaterally to auscultation without wheezes, rales, or rhonchi. Breathing is unlabored. Heart: RRR with S1 S2. No murmurs, rubs, or gallops appreciated. Abdomen: Soft, non-tender, non-distended with normoactive bowel sounds. No hepatomegaly. No rebound/guarding. No obvious abdominal masses. Msk:  Strength and tone normal for age. Extremities/Skin: Warm and dry. No clubbing or cyanosis. No edema. No rashes or suspicious lesions. Neuro: Alert and oriented X 3. Moves all extremities spontaneously. Gait is normal. CNII-XII grossly in tact. Psych:  Responds to questions appropriately with a normal affect.   Labs: Results for orders placed or performed in visit on 11/30/15  POCT urinalysis dipstick  Result Value Ref Range   Color, UA orange (A) yellow   Clarity, UA cloudy (A) clear   Glucose, UA negative negative   Bilirubin, UA small (A) negative   Ketones, POC UA small (15) (A) negative   Spec Grav, UA >=1.030    Blood, UA negative  negative   pH, UA 5.5    Protein Ur, POC trace (A) negative   Urobilinogen, UA 0.2    Nitrite, UA Negative Negative   Leukocytes, UA small (1+) (A) Negative     ASSESSMENT AND PLAN:  35 y.o. year old female with below diagnoses  By signing my name below, I, Nadim Abuhashem, attest that this documentation has been prepared under the direction and in the presence of Amanda Sidle, MD.  Electronically Signed: Conchita Paris, medical scribe. 11/30/2015 1:28 PM.    This chart was scribed in my presence and reviewed by me personally.    ICD-9-CM ICD-10-CM   1. Burning with urination 788.1 R30.0 POCT Microscopic Urinalysis ([facility])     POCT urinalysis dipstick     levofloxacin (LEVAQUIN) 500 MG tablet     Urine culture  2. Cerumen impaction, right 380.4 H61.21 Ambulatory referral to ENT  3. Acute upper respiratory infection 465.9 J06.9   4.  Acute maxillary sinusitis, recurrence not specified 461.0 J01.00 levofloxacin (LEVAQUIN) 500 MG tablet  5. Cough 786.2 R05 HYDROcodone-homatropine (HYCODAN) 5-1.5 MG/5ML syrup     Signed, Amanda SidleKurt Isabelle Matt, MD

## 2015-12-01 LAB — URINE CULTURE: Colony Count: 50000

## 2015-12-01 MED FILL — CITALOPRAM HBR 20 MG TABLET: 20 | 90 days supply | Qty: 180 | Fill #3

## 2015-12-01 MED FILL — busPIRone HCL 30 MG TABS: 30 | 90 days supply | Qty: 180 | Fill #3

## 2015-12-01 MED FILL — clonazePAM 1 MG TABS: 1 | 90 days supply | Qty: 225 | Fill #1

## 2015-12-02 ENCOUNTER — Telehealth: Payer: Self-pay | Admitting: *Deleted

## 2015-12-02 NOTE — Telephone Encounter (Signed)
Received a call from [place] from Dr. Allayne Stack office. It was in regards to them saying that the patient was not interested in coming into their office for the referral.

## 2015-12-18 ENCOUNTER — Other Ambulatory Visit: Payer: Self-pay | Admitting: Family Medicine

## 2015-12-18 MED FILL — ADVAIR 100/50 DISKUS: 100-50 | 30 days supply | Qty: 60 | Fill #0

## 2015-12-22 ENCOUNTER — Other Ambulatory Visit: Payer: Self-pay | Admitting: Internal Medicine

## 2015-12-22 ENCOUNTER — Other Ambulatory Visit: Payer: Self-pay | Admitting: Cardiology

## 2015-12-22 MED FILL — METFORMIN HCL ER 500 MG TAB: 500 | 90 days supply | Qty: 360 | Fill #0

## 2015-12-23 MED FILL — CARVEDILOL 25 MG TABLET: 25 | 30 days supply | Qty: 60 | Fill #0

## 2015-12-29 ENCOUNTER — Ambulatory Visit: Payer: 59 | Admitting: Cardiology

## 2016-01-05 MED FILL — VENTOLIN HFA 90 MCG INHALER: 108 (90 BAS | 16 days supply | Qty: 18 | Fill #3

## 2016-01-08 DIAGNOSIS — H52223 Regular astigmatism, bilateral: Secondary | ICD-10-CM | POA: Diagnosis not present

## 2016-01-08 DIAGNOSIS — H5212 Myopia, left eye: Secondary | ICD-10-CM | POA: Diagnosis not present

## 2016-01-19 ENCOUNTER — Other Ambulatory Visit: Payer: Self-pay | Admitting: Cardiology

## 2016-01-19 MED FILL — CARVEDILOL 25 MG TABLET: 25 | 30 days supply | Qty: 60 | Fill #0

## 2016-01-19 MED FILL — MONO-LINYAH 28 TABLET: 0.25-35 | 84 days supply | Qty: 84 | Fill #3

## 2016-02-04 ENCOUNTER — Encounter: Payer: Self-pay | Admitting: Family Medicine

## 2016-02-10 NOTE — Telephone Encounter (Signed)
Pt was checking to see if the doctor had or will write a letter for her new job. She sent a message in My Chart last week and wasn't sure if the message was received. jw

## 2016-02-11 ENCOUNTER — Ambulatory Visit: Payer: 59 | Admitting: Cardiology

## 2016-02-11 NOTE — Telephone Encounter (Signed)
Contacted pt and scheduled an appointment for this. Forwarding to PCP as an [place]. Lamonte SakaiZimmerman Rumple, Kellis Topete D, [place]

## 2016-02-16 MED FILL — ADVAIR 100/50 DISKUS: 100-50 | 30 days supply | Qty: 60 | Fill #1

## 2016-02-16 MED FILL — CARVEDILOL 25 MG TABLET: 25 | 30 days supply | Qty: 60 | Fill #1

## 2016-02-20 ENCOUNTER — Encounter: Payer: Self-pay | Admitting: Family Medicine

## 2016-02-20 ENCOUNTER — Ambulatory Visit (INDEPENDENT_AMBULATORY_CARE_PROVIDER_SITE_OTHER): Payer: 59 | Admitting: Family Medicine

## 2016-02-20 VITALS — BP 144/87 | HR 99 | Temp 98.2°F | Wt 357.4 lb

## 2016-02-20 DIAGNOSIS — Z114 Encounter for screening for human immunodeficiency virus [HIV]: Secondary | ICD-10-CM | POA: Diagnosis not present

## 2016-02-20 DIAGNOSIS — R234 Changes in skin texture: Secondary | ICD-10-CM | POA: Insufficient documentation

## 2016-02-20 DIAGNOSIS — R000 Tachycardia, unspecified: Secondary | ICD-10-CM

## 2016-02-20 DIAGNOSIS — I10 Essential (primary) hypertension: Secondary | ICD-10-CM

## 2016-02-20 DIAGNOSIS — H8113 Benign paroxysmal vertigo, bilateral: Secondary | ICD-10-CM

## 2016-02-20 DIAGNOSIS — R42 Dizziness and giddiness: Secondary | ICD-10-CM | POA: Diagnosis not present

## 2016-02-20 DIAGNOSIS — Z1159 Encounter for screening for other viral diseases: Secondary | ICD-10-CM | POA: Diagnosis not present

## 2016-02-20 LAB — HIV ANTIBODY (ROUTINE TESTING W REFLEX): HIV: NONREACTIVE

## 2016-02-20 NOTE — Patient Instructions (Signed)
It was nice seeing you today. I have written your letter for work. I will refer you to ENT since symptoms persist. Also I will like for you to get mammogram due to the change in your breast color. See me back in 1 wk or 2 for reassessment.

## 2016-02-20 NOTE — Assessment & Plan Note (Signed)
Left breast color change. As discussed with patient, rash does not look infectious to me. Also she has used OTC topical agents with no improvement. I will like to r/o inflammatory breast cancer. Mammogram ordered. If no improvement and mammogram is normal to consider skin biopsy. F/U in 2 wks.

## 2016-02-20 NOTE — Assessment & Plan Note (Signed)
Asymptomatic. On Coreg. Repeat HR after she rested improved a lot. Continue current dose of Coreg.
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
## 2016-02-20 NOTE — Assessment & Plan Note (Signed)
Doing well on Coreg. Continue current regimen.

## 2016-02-20 NOTE — Assessment & Plan Note (Signed)
Chronic recurrent. She has been fully worked up for this in the past. I gave her letter for her job. I recommended ENT assessment since this remains persistent. Continue Meclizine prn. F/U as needed.

## 2016-02-23 ENCOUNTER — Encounter: Payer: Self-pay | Admitting: Family Medicine

## 2016-02-23 NOTE — Addendum Note (Signed)
Addended by: Janit PaganENIOLA, Ishika Chesterfield T on: 02/23/2016 02:11 PM   Modules accepted: Orders

## 2016-03-02 MED FILL — CITALOPRAM HBR 20 MG TABLET: 20 | 90 days supply | Qty: 180 | Fill #0

## 2016-03-02 MED FILL — busPIRone HCL 30 MG TABS: 30 | 90 days supply | Qty: 180 | Fill #0

## 2016-03-02 MED FILL — clonazePAM 1 MG TABS: 1 | 90 days supply | Qty: 225 | Fill #0

## 2016-03-12 ENCOUNTER — Encounter: Payer: Self-pay | Admitting: Family Medicine

## 2016-03-15 NOTE — Telephone Encounter (Signed)
Will forward to MD.  

## 2016-03-16 ENCOUNTER — Ambulatory Visit: Payer: Self-pay | Admitting: Family Medicine

## 2016-03-17 ENCOUNTER — Ambulatory Visit: Payer: 59 | Admitting: Cardiology

## 2016-03-22 ENCOUNTER — Ambulatory Visit
Admission: RE | Admit: 2016-03-22 | Discharge: 2016-03-22 | Disposition: A | Discharge status: Home / Self Care | Payer: 59 | Source: Ambulatory Visit | Attending: Family Medicine | Admitting: Family Medicine

## 2016-03-22 ENCOUNTER — Other Ambulatory Visit: Payer: Self-pay | Admitting: Family Medicine

## 2016-03-22 ENCOUNTER — Ambulatory Visit
Admission: RE | Admit: 2016-03-22 | Discharge: 2016-03-22 | Disposition: A | Discharge status: Home / Self Care | Payer: Self-pay | Source: Ambulatory Visit | Attending: Family Medicine | Admitting: Family Medicine

## 2016-03-22 DIAGNOSIS — R234 Changes in skin texture: Secondary | ICD-10-CM

## 2016-03-22 DIAGNOSIS — N644 Mastodynia: Secondary | ICD-10-CM | POA: Diagnosis not present

## 2016-03-22 MED FILL — METFORMIN HCL ER 500 MG TAB: 500 | 90 days supply | Qty: 360 | Fill #1

## 2016-03-24 ENCOUNTER — Encounter: Payer: Self-pay | Admitting: Cardiology

## 2016-03-24 ENCOUNTER — Encounter: Payer: Self-pay | Admitting: Family Medicine

## 2016-03-24 ENCOUNTER — Ambulatory Visit (INDEPENDENT_AMBULATORY_CARE_PROVIDER_SITE_OTHER): Payer: 59 | Admitting: Cardiology

## 2016-03-24 VITALS — BP 110/88 | HR 95 | Ht 67.0 in | Wt 354.4 lb

## 2016-03-24 DIAGNOSIS — I517 Cardiomegaly: Secondary | ICD-10-CM | POA: Diagnosis not present

## 2016-03-24 DIAGNOSIS — R0602 Shortness of breath: Secondary | ICD-10-CM | POA: Insufficient documentation

## 2016-03-24 DIAGNOSIS — I493 Ventricular premature depolarization: Secondary | ICD-10-CM

## 2016-03-24 DIAGNOSIS — R002 Palpitations: Secondary | ICD-10-CM

## 2016-03-24 DIAGNOSIS — I10 Essential (primary) hypertension: Secondary | ICD-10-CM | POA: Diagnosis not present

## 2016-03-24 DIAGNOSIS — I119 Hypertensive heart disease without heart failure: Secondary | ICD-10-CM | POA: Insufficient documentation

## 2016-03-24 LAB — COMPREHENSIVE METABOLIC PANEL
ALT: 24 U/L (ref 6–29)
AST: 19 U/L (ref 10–30)
Albumin: 3.9 g/dL (ref 3.6–5.1)
Alkaline Phosphatase: 66 U/L (ref 33–115)
BUN: 8 mg/dL (ref 7–25)
CO2: 24 mmol/L (ref 20–31)
Calcium: 9.4 mg/dL (ref 8.6–10.2)
Chloride: 102 mmol/L (ref 98–110)
Creat: 0.69 mg/dL (ref 0.50–1.10)
Glucose, Bld: 111 mg/dL — ABNORMAL HIGH (ref 65–99)
Potassium: 4.3 mmol/L (ref 3.5–5.3)
Sodium: 139 mmol/L (ref 135–146)
Total Bilirubin: 0.3 mg/dL (ref 0.2–1.2)
Total Protein: 7 g/dL (ref 6.1–8.1)

## 2016-03-24 LAB — TSH: TSH: 3.35 mIU/L

## 2016-03-24 LAB — T3, FREE: T3, Free: 2.9 pg/mL (ref 2.3–4.2)

## 2016-03-24 LAB — T4, FREE: Free T4: 1.3 ng/dL (ref 0.8–1.8)

## 2016-03-24 NOTE — Patient Instructions (Addendum)
Medication Instructions:   Your physician recommends that you continue on your current medications as directed. Please refer to the Current Medication list given to you today.   Labwork:  TODAY--CMET, TSH, FREE T 3, FREE T 4    Follow-Up:  Your physician wants you to follow-up in: ONE YEAR WITH DR Johnell ComingsNELSON You will receive a reminder letter in the mail two months in advance. If you don't receive a letter, please call our office to schedule the follow-up appointment.        If you need a refill on your cardiac medications before your next appointment, please call your pharmacy.

## 2016-03-24 NOTE — Progress Notes (Signed)
Patient ID: Amanda Massey, female   DOB: 07-Oct-1981, 35 y.o.   MRN: [id number]    Patient Name: Amanda Massey Date of Encounter: 03/24/2016  Primary Care Provider:  Janit Pagan, MD Primary Cardiologist:  Lars Masson  Problem List   Past Medical History  Diagnosis Date  . PCOS (polycystic ovarian syndrome)   . Anxiety   . Asthma   . Vertigo   . Prediabetes   . Obesity   . Hypertension     no rx meds  . Seasonal allergies   . Back pain     tx with OTC meds  . Allergy   . Depression    Past Surgical History  Procedure Laterality Date  . Wisdom tooth extraction    . Multiple tooth extractions      for braces  . Svd      x 1  . Dilation and evacuation  03/17/2012    Procedure: DILATATION AND EVACUATION;  Surgeon: Jeani Hawking, MD;  Location: [facility];  Service: Gynecology;  Laterality: N/A;   Allergies  No Known Allergies  HPI  A very anxious appearing female coming for evaluation of dizziness and palpitations. She has been followed by her PCP for dizziness and started on Meclizine with significant improvement of symptoms. No syncope.  She underwent 48 hour Holter monitoring that showed sinus brady to tachycardia and frequent PVCs (1500 in 24 hours). She denies any chest pain, has some SOB on exertion. She doesn't exercise.  No LE edema. No orthopnea, PND. She has never smoked. No FH of prematuer CAD. She mentions multiple times during conversation that she is anxious and so is her husband.   03/24/16 - she feels great, improved palpitations and SOB, she is happy as she got a new better job and a new place to live. BP controlled,  Compliant with her meds, but doesn't exercise at all, complains of back pains.  No chest pain or syncope, no orthopnea, PND.  Home Medications  Prior to Admission medications   Medication Sig Start Date End Date Taking? Authorizing Provider  albuterol (VENTOLIN HFA) 108 (90 BASE) MCG/ACT inhaler Inhale 2 puffs into the  lungs every 4 (four) hours as needed for wheezing or shortness of breath. 04/04/14  Yes Janit Pagan, MD  b complex vitamins tablet Take 2 tablets by mouth daily.    Yes Historical Provider, MD  busPIRone (BUSPAR) 30 MG tablet Take 30 mg by mouth 2 (two) times daily.   Yes Historical Provider, MD  Celery Seed OIL Take 2 capsules by mouth daily. Takes for BP   Yes Historical Provider, MD  Cholecalciferol (VITAMIN D3) 5000 UNITS TABS Take 1 tablet by mouth every morning.   Yes Historical Provider, MD  clonazePAM (KLONOPIN) 1 MG tablet Take 1 mg by mouth 3 (three) times daily.    Yes Historical Provider, MD  diazepam (VALIUM) 5 MG tablet Take 1 tablet (5 mg total) by mouth every 6 (six) hours as needed (for vertigo). 06/24/14  Yes Reva Bores, MD  Fluticasone-Salmeterol (ADVAIR) 100-50 MCG/DOSE AEPB Inhale 1 puff into the lungs daily as needed. 12/21/13  Yes Janit Pagan, MD  ketotifen (ZADITOR) 0.025 % ophthalmic solution 1 drop 2 (two) times daily.   Yes Historical Provider, MD  meclizine (ANTIVERT) 50 MG tablet Take 1 tablet (50 mg total) by mouth 3 (three) times daily as needed. 06/24/14  Yes Reva Bores, MD  metFORMIN (GLUCOPHAGE) 500 MG tablet Take 500 mg by  mouth daily with breakfast.   Yes Historical Provider, MD  norethindrone-ethinyl estradiol-iron (ESTROSTEP FE,TILIA FE,TRI-LEGEST FE) 1-20/1-30/1-35 MG-MCG tablet Take 1 tablet by mouth daily.   Yes Historical Provider, MD  promethazine (PHENERGAN) 25 MG tablet Take 1 tablet (25 mg total) by mouth every 6 (six) hours as needed for nausea. 06/24/14  Yes Reva Bores, MD    Family History  Family History  Problem Relation Age of Onset  . Parkinsonism Father   . Alcohol abuse Neg Hx   . Depression Neg Hx   . Diabetes Neg Hx   . Hypertension Neg Hx   . Hyperlipidemia Neg Hx   . Heart attack Paternal Grandfather   . Cancer Paternal Grandmother     Social History  Social History   Social History  . Marital Status: Married     Spouse Name: N/A  . Number of Children: N/A  . Years of Education: N/A   Occupational History  . Not on file.   Social History Main Topics  . Smoking status: Never Smoker   . Smokeless tobacco: Never Used  . Alcohol Use: No  . Drug Use: No  . Sexual Activity: Yes    Birth Control/ Protection: Pill     Comment: Pregnant approx 8 wks   Other Topics Concern  . Not on file   Social History Narrative     Review of Systems, as per HPI, otherwise negative General:  No chills, fever, night sweats or weight changes.  Cardiovascular:  No chest pain, dyspnea on exertion, edema, orthopnea, palpitations, paroxysmal nocturnal dyspnea. Dermatological: No rash, lesions/masses Respiratory: No cough, dyspnea Urologic: No hematuria, dysuria Abdominal:   No nausea, vomiting, diarrhea, bright red blood per rectum, melena, or hematemesis Neurologic:  No visual changes, wkns, changes in mental status. All other systems reviewed and are otherwise negative except as noted above.  Physical Exam  Last menstrual period 02/10/2016.  General: Pleasant, NAD Psych: Normal affect. Neuro: Alert and oriented X 3. Moves all extremities spontaneously. HEENT: Normal  Neck: Supple without bruits or JVD. Lungs:  Resp regular and unlabored, CTA. Heart: RRR no s3, s4, or murmurs. Abdomen: Soft, non-tender, non-distended, BS + x 4.  Extremities: No clubbing, cyanosis or edema. DP/PT/Radials 2+ and equal bilaterally.  Labs:  No results for input(s): CKTOTAL, CKMB, TROPONINI in the last 72 hours. Lab Results  Component Value Date   WBC 12.5* 08/10/2014   HGB 13.5 08/10/2014   HCT 41.1 08/10/2014   MCV 89 08/10/2014   PLT 449* 08/10/2014    No results found for: DDIMER Invalid input(s): POCBNP    Component Value Date/Time   NA 140 08/10/2014 2238   NA 140 07/04/2014 1439   K 3.4* 08/10/2014 2238   K 4.3 07/04/2014 1439   CL 104 08/10/2014 2238   CL 103 07/04/2014 1439   CO2 29 08/10/2014 2238    CO2 27 07/04/2014 1439   GLUCOSE 117* 08/10/2014 2238   GLUCOSE 118* 07/04/2014 1439   BUN 6* 08/10/2014 2238   BUN 10 07/04/2014 1439   CREATININE 0.84 08/10/2014 2238   CREATININE 0.76 07/04/2014 1439   CREATININE 0.77 08/08/2013 1905   CALCIUM 9.1 08/10/2014 2238   CALCIUM 9.2 07/04/2014 1439   PROT 7.5 09/23/2014 1410   ALBUMIN 3.2* 09/23/2014 1410   AST 21 09/23/2014 1410   ALT 22 09/23/2014 1410   ALKPHOS 60 09/23/2014 1410   BILITOT 0.4 09/23/2014 1410   GFRNONAA >60 08/10/2014 2238   GFRNONAA >90  08/08/2013 1905   GFRAA >60 08/10/2014 2238   GFRAA >90 08/08/2013 1905   Lab Results  Component Value Date   CHOL 156 09/23/2014   HDL 37.40* 09/23/2014   LDLCALC 84 09/23/2014   TRIG 173.0* 09/23/2014    Accessory Clinical Findings  Echocardiogram - 08/16/2014  Study Conclusions  - Left ventricle: The cavity size was normal. There was mild concentric hypertrophy. Systolic function was normal. The estimated ejection fraction was in the range of 50% to 55%. Wall motion was normal; there were no regional wall motion abnormalities. Left ventricular diastolic function parameters were normal. - Aortic root: The aortic root was normal in size. Ascending aorta upper normal size measuring 4 cm. - Mitral valve: Structurally normal valve. There was no regurgitation. - Left atrium: The atrium was normal in size. - Right ventricle: Systolic function was normal. - Right atrium: The atrium was normal in size. - Tricuspid valve: There was no regurgitation. - Pericardium, extracardiac: There was no pericardial effusion.  Impressions: - Mild LVH, otherwise normal study.  ECG - Sinus rhythm, normal EKG.h    Assessment & Plan  Morbidly obese 35 year old female   1. Palpitations, frequent PVCs - resolved after increasing carvedilol to 25 mg po BID. Advised on a regular exercise program and good hydration.   2. Hypertensive heart disease without CHF - controlled with  carvedilol, echo shows mild LVH  3. Obesity - encouraged to exercise  4. Hypothyroidism - we will recheck TSH, fT3, fT4  Follow up in 1 year.   Lars MassonNELSON, Donell Tomkins H, MD, [facility] 03/24/2016, 11:58 AM

## 2016-03-26 ENCOUNTER — Other Ambulatory Visit: Payer: Self-pay | Admitting: Cardiology

## 2016-03-26 MED FILL — CARVEDILOL 25 MG TABLET: 25 | 30 days supply | Qty: 60 | Fill #0

## 2016-03-26 MED FILL — VENTOLIN HFA 90 MCG INHALER: 108 (90 BAS | 25 days supply | Qty: 18 | Fill #4

## 2016-04-01 ENCOUNTER — Encounter: Payer: Self-pay | Admitting: Emergency Medicine

## 2016-04-01 ENCOUNTER — Emergency Department
Admission: EM | Admit: 2016-04-01 | Discharge: 2016-04-01 | Disposition: A | Discharge status: Home / Self Care | Payer: 59 | Source: Home / Self Care | Attending: Family Medicine | Admitting: Family Medicine

## 2016-04-01 DIAGNOSIS — J029 Acute pharyngitis, unspecified: Secondary | ICD-10-CM

## 2016-04-01 LAB — POCT RAPID STREP A (OFFICE): RAPID STREP A SCREEN: NEGATIVE

## 2016-04-01 MED ORDER — PREDNISONE 20 MG PO TABS: ORAL_TABLET | ORAL | Status: DC

## 2016-04-01 NOTE — ED Provider Notes (Signed)
CSN: [id number]     Arrival date & time 04/01/16  1352 History   First MD Initiated Contact with Patient 04/01/16 1409     Chief Complaint  Patient presents with  . Sore Throat      HPI Comments: Patient complains of onset of mild sore throat about one week ago followed by gradual increase in sinus congestion and headache.  She has felt fatigued and had occasional minimal cough.  Her right ear feels clogged without pain.  No fevers, chills, and sweats. She has asthma controlled with Advair and albuterol inhaler, and has had to use her albuterol more frequently.  The history is provided by the patient and the spouse.    Past Medical History  Diagnosis Date  . PCOS (polycystic ovarian syndrome)   . Anxiety   . Asthma   . Vertigo   . Prediabetes   . Obesity   . Hypertension     no rx meds  . Seasonal allergies   . Back pain     tx with OTC meds  . Allergy   . Depression    Past Surgical History  Procedure Laterality Date  . Wisdom tooth extraction    . Multiple tooth extractions      for braces  . Svd      x 1  . Dilation and evacuation  03/17/2012    Procedure: DILATATION AND EVACUATION;  Surgeon: Jeani Hawking, MD;  Location: [facility];  Service: Gynecology;  Laterality: N/A;   Family History  Problem Relation Age of Onset  . Parkinsonism Father   . Alcohol abuse Neg Hx   . Depression Neg Hx   . Diabetes Neg Hx   . Hypertension Neg Hx   . Hyperlipidemia Neg Hx   . Heart attack Paternal Grandfather   . Cancer Paternal Grandmother    Social History  Substance Use Topics  . Smoking status: Never Smoker   . Smokeless tobacco: Never Used  . Alcohol Use: No   OB History    Gravida Para Term Preterm AB TAB SAB Ectopic Multiple Living   Review of Systems + sore throat + occasional cough No pleuritic pain No wheezing + nasal congestion + post-nasal drainage No sinus pain/pressure No itchy/red eyes ? earache No hemoptysis No SOB No  fever/chills No nausea No vomiting No abdominal pain No diarrhea No urinary symptoms No skin rash + fatigue No myalgias + headache Used OTC meds without relief  Allergies  Review of patient's allergies indicates no known allergies.  Home Medications   Prior to Admission medications   Medication Sig Start Date End Date Taking? Authorizing Provider  ADVAIR DISKUS 100-50 MCG/DOSE AEPB INHALE 1 PUFF BY MOUTH TWICE DAILY 12/18/15   Doreene Eland, MD  b complex vitamins tablet Take 2 tablets by mouth daily.     Historical Provider, MD  busPIRone (BUSPAR) 30 MG tablet Take 30 mg by mouth 2 (two) times daily.    Historical Provider, MD  carvedilol (COREG) 25 MG tablet Take 1 tablet (25 mg total) by mouth 2 (two) times daily with a meal. 03/26/16   Lars Masson, MD  cetirizine (ZYRTEC) 10 MG tablet Take 10 mg by mouth daily. Reported on 11/30/2015    Historical Provider, MD  Cholecalciferol (VITAMIN D3) 5000 UNITS TABS Take 1 tablet by mouth every morning.    Historical Provider, MD  citalopram (CELEXA) 20  MG tablet Take 20 mg by mouth 2 (two) times daily.    Historical Provider, MD  clonazePAM (KLONOPIN) 1 MG tablet Take 1 mg by mouth 3 (three) times daily. Take as directed    Historical Provider, MD  ketotifen (ZADITOR) 0.025 % ophthalmic solution 1 drop 2 (two) times daily.    Historical Provider, MD  meclizine (ANTIVERT) 25 MG tablet Take 25 mg by mouth 3 (three) times daily as needed for dizziness.    Historical Provider, MD  metFORMIN (GLUCOPHAGE-XR) 500 MG 24 hr tablet TAKE 2 TABLETS BY MOUTH TWICE DAILY WITH A MEAL 12/22/15   Carlus Pavlovristina Gherghe, MD  norgestimate-ethinyl estradiol (ORTHO-CYCLEN, 28,) 0.25-35 MG-MCG tablet Take 1 tablet by mouth daily. 06/19/15   Carlus Pavlovristina Gherghe, MD  predniSONE (DELTASONE) 20 MG tablet Take one tab by mouth twice daily for 5 days, then one daily for 3 days. Take with food. 04/01/16   Lattie HawStephen A Zahra Peffley, MD  promethazine (PHENERGAN) 25 MG tablet Take 1 tablet  (25 mg total) by mouth every 6 (six) hours as needed for nausea. 06/24/14   Reva Boresanya S Pratt, MD  VENTOLIN HFA 108 (90 BASE) MCG/ACT inhaler INHALE 2 PUFFS INTO THE LUNGS EVERY 4 HOURS AS NEEDED FOR WHEEZING OR SHORTNESS OF BREATH. 04/29/15   Doreene ElandKehinde T Eniola, MD   Meds Ordered and Administered this Visit  Medications - No data to display  BP 146/89 mmHg  Pulse 102  Temp(Src) 97.6 F (36.4 C) (Oral)  Ht 5\' 6"  (1.676 m)  Wt 354 lb (160.573 kg)  BMI 57.16 kg/m2  SpO2 95%  LMP 03/12/2016 No data found.   Physical Exam Nursing notes and Vital Signs reviewed. Appearance:  Patient appears stated age, and in no acute distress.  Patient is obese (BMI 57.2) Eyes:  Pupils are equal, round, and reactive to light and accomodation.  Extraocular movement is intact.  Conjunctivae are not inflamed  Ears:    Right canal occluded with cerumen.  Left canal and tympanic membrane normal. Nose:  Mildly congested turbinates.  No sinus tenderness.    Pharynx:  Normal except uvula slightly edematous Neck:  Supple.  Tender enlarged posterior/lateral nodes are palpated bilaterally  Lungs:  Clear to auscultation.  Breath sounds are equal.  Moving air well. Heart:  Regular rate and rhythm without murmurs, rubs, or gallops.  Abdomen:  Nontender without masses or hepatosplenomegaly.  Bowel sounds are present.  No CVA or flank tenderness.  Extremities:  No edema.  Skin:  No rash present.    ED Course  Procedures none    Labs Reviewed  STREP A DNA PROBE  POCT RAPID STREP A (OFFICE) negative     MDM   1. Pharyngitis; suspect early viral URI    Throat culture pending                                          If increasing cold symptoms develop, begin prednisone burst/taper As cough develops, take plain guaifenesin (1200mg  extended release tabs such as Mucinex) twice daily, with plenty of water, for cough and congestion.  Get adequate rest.   May use Afrin nasal spray (or generic oxymetazoline) twice daily for  about 5 days and then discontinue.  Also recommend using saline nasal spray several times daily and saline nasal irrigation (AYR is a common brand).  Use Flonase nasal spray each morning after using Afrin nasal spray  and saline nasal irrigation. Try warm salt water gargles for sore throat.  Stop all antihistamines for now, and other non-prescription cough/cold preparations. May take Delsym Cough Suppressant at bedtime for nighttime cough.  Continue Advair and albuterol inhalers.   Follow-up with family doctor if not improving about10 days.     Lattie Haw, MD 04/01/16 [phone]

## 2016-04-01 NOTE — Discharge Instructions (Signed)
If increasing cold symptoms develop, begin prednisone. As cough develops, take plain guaifenesin (1200mg  extended release tabs such as Mucinex) twice daily, with plenty of water, for cough and congestion.  Get adequate rest.   May use Afrin nasal spray (or generic oxymetazoline) twice daily for about 5 days and then discontinue.  Also recommend using saline nasal spray several times daily and saline nasal irrigation (AYR is a common brand).  Use Flonase nasal spray each morning after using Afrin nasal spray and saline nasal irrigation. Try warm salt water gargles for sore throat.  Stop all antihistamines for now, and other non-prescription cough/cold preparations. May take Delsym Cough Suppressant at bedtime for nighttime cough.  Continue Advair and albuterol inhalers.   Follow-up with family doctor if not improving about10 days.

## 2016-04-01 NOTE — ED Notes (Signed)
Sore throat x 1 week, swollen, headache, rt ear pain, pressure in head, fatigue

## 2016-04-02 ENCOUNTER — Telehealth: Payer: Self-pay | Admitting: *Deleted

## 2016-04-02 LAB — STREP A DNA PROBE: GASP: NOT DETECTED

## 2016-04-02 MED FILL — MONO-LINYAH 28 TABLET: 0.25-35 | 56 days supply | Qty: 56 | Fill #4

## 2016-04-02 NOTE — ED Notes (Signed)
Callback: Patient advised of negative TCX. She reports she is already improving with the prednisone.

## 2016-04-21 MED FILL — ADVAIR 100/50 DISKUS: 100-50 | 30 days supply | Qty: 60 | Fill #2

## 2016-04-21 MED FILL — CARVEDILOL 25 MG TABLET: 25 | 30 days supply | Qty: 60 | Fill #1

## 2016-04-27 ENCOUNTER — Encounter: Payer: Self-pay | Admitting: Family Medicine

## 2016-04-28 ENCOUNTER — Encounter: Payer: Self-pay | Admitting: Family Medicine

## 2016-05-20 ENCOUNTER — Other Ambulatory Visit: Payer: Self-pay | Admitting: Family Medicine

## 2016-05-20 MED FILL — PROMETHAZINE 25 MG TABLET: 25 | 10 days supply | Qty: 42 | Fill #0

## 2016-05-21 MED FILL — CARVEDILOL 25 MG TABLET: 25 | 30 days supply | Qty: 60 | Fill #2

## 2016-05-25 DIAGNOSIS — Z6841 Body Mass Index (BMI) 40.0 and over, adult: Secondary | ICD-10-CM | POA: Diagnosis not present

## 2016-05-25 DIAGNOSIS — M545 Low back pain: Secondary | ICD-10-CM | POA: Diagnosis not present

## 2016-05-25 MED FILL — METHYLPREDNISOLONE 4 MG TAB: 4 | 6 days supply | Qty: 21 | Fill #0

## 2016-05-25 MED FILL — traMADol HCL 50 MG TABS: 50 | 7 days supply | Qty: 50 | Fill #0

## 2016-05-25 MED FILL — CYCLOBENZAPRINE 10 MG TAB: 10 | 16 days supply | Qty: 50 | Fill #0

## 2016-06-04 ENCOUNTER — Other Ambulatory Visit: Payer: Self-pay | Admitting: Internal Medicine

## 2016-06-04 MED FILL — busPIRone HCL 30 MG TABS: 30 | 90 days supply | Qty: 180 | Fill #1

## 2016-06-04 MED FILL — CITALOPRAM HBR 20 MG TABLET: 20 | 90 days supply | Qty: 180 | Fill #1

## 2016-06-07 MED FILL — clonazePAM 1 MG TABS: 1 | 90 days supply | Qty: 225 | Fill #0

## 2016-06-07 MED FILL — MONO-LINYAH 28 TABLET: 0.25-35 | 84 days supply | Qty: 84 | Fill #0

## 2016-06-08 MED FILL — traMADol HCL 50 MG TABS: 50 | 7 days supply | Qty: 50 | Fill #1

## 2016-06-14 MED FILL — HYDROCODON-APAP 5-325: 5-325 | 12 days supply | Qty: 50 | Fill #0

## 2016-06-22 ENCOUNTER — Other Ambulatory Visit: Payer: Self-pay | Admitting: Family Medicine

## 2016-06-22 MED FILL — ADVAIR 100/50 DISKUS: 100-50 | 30 days supply | Qty: 60 | Fill #3

## 2016-06-22 MED FILL — CYCLOBENZAPRINE 10 MG TAB: 10 | 16 days supply | Qty: 50 | Fill #1

## 2016-06-22 MED FILL — CARVEDILOL 25 MG TABLET: 25 | 30 days supply | Qty: 60 | Fill #3

## 2016-06-22 MED FILL — VENTOLIN HFA 90 MCG INHALER: 108 (90 BAS | 25 days supply | Qty: 18 | Fill #0

## 2016-06-28 ENCOUNTER — Other Ambulatory Visit: Payer: Self-pay

## 2016-06-28 MED ORDER — METFORMIN HCL ER 500 MG PO TB24: ORAL_TABLET | ORAL | 0 refills | Status: DC

## 2016-06-28 MED FILL — METFORMIN HCL ER 500 MG TAB: 500 | 45 days supply | Qty: 180 | Fill #0

## 2016-07-02 MED FILL — HYDROCODON-APAP 5-325: 5-325 | 12 days supply | Qty: 50 | Fill #0

## 2016-07-16 MED FILL — CARVEDILOL 25 MG TABLET: 25 | 30 days supply | Qty: 60 | Fill #4

## 2016-07-16 MED FILL — CYCLOBENZAPRINE 10 MG TAB: 10 | 16 days supply | Qty: 50 | Fill #0

## 2016-08-06 ENCOUNTER — Other Ambulatory Visit: Payer: Self-pay

## 2016-08-10 ENCOUNTER — Encounter: Payer: Self-pay | Admitting: Internal Medicine

## 2016-08-10 ENCOUNTER — Ambulatory Visit (INDEPENDENT_AMBULATORY_CARE_PROVIDER_SITE_OTHER): Payer: 59 | Admitting: Internal Medicine

## 2016-08-10 VITALS — BP 135/104 | HR 101 | Ht 66.5 in | Wt 345.0 lb

## 2016-08-10 DIAGNOSIS — E282 Polycystic ovarian syndrome: Secondary | ICD-10-CM | POA: Diagnosis not present

## 2016-08-10 DIAGNOSIS — R7303 Prediabetes: Secondary | ICD-10-CM

## 2016-08-10 LAB — LIPID PANEL
Cholesterol: 167 mg/dL (ref 0–200)
HDL: 50.1 mg/dL (ref >39.00)
NonHDL: 116.61
Total CHOL/HDL Ratio: 3
Triglycerides: 374 mg/dL — ABNORMAL HIGH (ref 0.0–149.0)
VLDL: 74.8 mg/dL — ABNORMAL HIGH (ref 0.0–40.0)

## 2016-08-10 LAB — HEMOGLOBIN A1C: HEMOGLOBIN A1C: 6 % (ref 4.6–6.5)

## 2016-08-10 LAB — LDL CHOLESTEROL, DIRECT: LDL DIRECT: 78 mg/dL

## 2016-08-10 LAB — VITAMIN D 25 HYDROXY (VIT D DEFICIENCY, FRACTURES): VITD: 63.48 ng/mL (ref 30.00–100.00)

## 2016-08-10 NOTE — Patient Instructions (Addendum)
Please stop at the lab.  Please continue Orthocyclen and Metformin 1000 mg 2x a day.  Please return in 1 year.

## 2016-08-10 NOTE — Progress Notes (Signed)
Patient ID: Amanda Massey, female   DOB: 04-May-1981, 35 y.o.   MRN: [id number]  HPI: Amanda Massey is a 35 y.o.-year-old female, initially referred by her nutritionist, Denny Levy for management of PCOS and prediabetes. Last visit 14 mo ago. ObGyn: Dr. Adalberto Ill.  She is having pbs with vertigo again. She initially saw cardiology as she also had palpitations >> Holter monitor, 2DEcho >> started Carvedilol >> sxs resolved.  She and her husband decided against another pregnancy.  She was dx with PCOS in 2009.  Reviewed and addended hx: Fertility/Menstrual cycles: - menarche at 35 y/o - on OCP at 35 y/o >> then came off OCPs at 35 y/o - irregular menses after coming off OCP >> 3x a year - it took her 2 years to get pregnant, used Clomid - saw Dr Elesa Hacker >> tried many IUI tx's, other procedures except IVF >> did not work - they decided against another pregnancy (daughter born in 2002) - + h/o ovarian cysts - children: 1, but tried for another one  - miscarriages: 1 - lost baby at 8 weeks in 2013 - contraception: OCPs now - LoLoestrin >> OrthoCyclen  - some months with increased flow  Weight gain: - stayed stable for most of her adult life - in ~2014 lost 60 lbs over 6 mo - diet (low carb) and exercise  - no steroid use, other than Prednisone for URI - no weight loss meds - working with nutrition, Denny Levy - Meals: - Breakfast: she usually skips it, but if she eats then cereal or eggs/cheese biscuit - Lunch: sandwich - Dinner: some fast food - Snacks: very rare usually not Drinks: no soft drinks every day, mostly water or milk - Diets tried: decreased portions, less starches - Exercise: not consistently  Acne: - no  Hirsutism: - upper lip/neck/chin - is using hair removal creams - also shaving - not bothering her  Treatments tried: - on Metformin >> increased to 1000 mg 2x a day - did not try Spironolactone - did not try Vaniqua - on OCPs (Ortho  Evra)  Last testosterone - high on LoLoEstrin: Component     Latest Ref Rng & Units 09/23/2014 06/17/2015  Testosterone     10 - 70 ng/dL 71 (H) 61  Sex Horm Binding Glob, Serum     17 - 124 nmol/L 55 36  Testosterone Free     0.6 - 6.8 pg/mL 9.2 (H) 10.5 (H)  Testosterone-% Free     0.4 - 2.4 % 1.3 1.7   - Last thyroid tests: Lab Results  Component Value Date   TSH 3.35 03/24/2016   FREET4 1.3 03/24/2016   - Last set of lipids:    Component Value Date/Time   CHOL 156 09/23/2014 1410   TRIG 173.0 (H) 09/23/2014 1410   HDL 37.40 (L) 09/23/2014 1410   CHOLHDL 4 09/23/2014 1410   VLDL 34.6 09/23/2014 1410   LDLCALC 84 09/23/2014 1410   - Last HbA1c: Lab Results  Component Value Date   HGBA1C 5.6 06/17/2015   - last CMP:   Chemistry      Component Value Date/Time   NA 139 03/24/2016 1235   NA 140 08/10/2014 2238   K 4.3 03/24/2016 1235   K 3.4 (L) 08/10/2014 2238   CL 102 03/24/2016 1235   CL 104 08/10/2014 2238   CO2 24 03/24/2016 1235   CO2 29 08/10/2014 2238   BUN 8 03/24/2016 1235   BUN 6 (L) 08/10/2014  2238   CREATININE 0.69 03/24/2016 1235      Component Value Date/Time   CALCIUM 9.4 03/24/2016 1235   CALCIUM 9.1 08/10/2014 2238   ALKPHOS 66 03/24/2016 1235   AST 19 03/24/2016 1235   ALT 24 03/24/2016 1235   BILITOT 0.3 03/24/2016 1235     Prediabetes: Last hemoglobin A1c was: Lab Results  Component Value Date   HGBA1C 5.6 06/17/2015   HGBA1C 5.8 09/23/2014   HGBA1C 5.6 04/25/2013   Pt is on a regimen of: - Metformin XR 1000 mg bid She was on Byetta in the past.  No FH of DM; she has a h/o GDM in 2003.   ROS: Constitutional: + weight loss, + fatigue, + subjective hyperthermia Eyes: no blurry vision, no xerophthalmia ENT: no sore throat, no nodules palpated in throat, no dysphagia/odynophagia, no hoarseness Cardiovascular: no CP/SOB/palpitations/leg swelling Respiratory: no cough/SOB Gastrointestinal: no N/V/+ D/no C Musculoskeletal: no  muscle/joint aches Skin: no acne, + hair on face Neurological: no tremors/numbness/tingling/dizziness  I reviewed pt's medications, allergies, PMH, social hx, family hx, and changes were documented in the history of present illness. Otherwise, unchanged from my initial visit note:  Past Medical History:  Diagnosis Date  . Allergy   . Anxiety   . Asthma   . Back pain    tx with OTC meds  . Depression   . Hypertension    no rx meds  . Obesity   . PCOS (polycystic ovarian syndrome)   . Prediabetes   . Seasonal allergies   . Vertigo    Past Surgical History:  Procedure Laterality Date  . DILATION AND EVACUATION  03/17/2012   Procedure: DILATATION AND EVACUATION;  Surgeon: Jeani Hawking, MD;  Location: [facility];  Service: Gynecology;  Laterality: N/A;  . MULTIPLE TOOTH EXTRACTIONS     for braces  . svd     x 1  . WISDOM TOOTH EXTRACTION     History   Social History  . Marital Status: Married    Spouse Name: N/A    Number of Children: 1   Occupational History  . 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 Smoking status: Never Smoker   . Smokeless tobacco: Never Used  . Alcohol Use: No  . Drug Use: No   Social History Narrative   Current Outpatient Prescriptions on File Prior to Visit  Medication Sig Dispense Refill  . ADVAIR DISKUS 100-50 MCG/DOSE AEPB INHALE 1 PUFF BY MOUTH TWICE DAILY 60 each 3  . b complex vitamins tablet Take 2 tablets by mouth daily.     . busPIRone (BUSPAR) 30 MG tablet Take 15 mg by mouth 2 (two) times daily.     . carvedilol (COREG) 25 MG tablet Take 1 tablet (25 mg total) by mouth 2 (two) times daily with a meal. 60 tablet 11  . cetirizine (ZYRTEC) 10 MG tablet Take 10 mg by mouth daily. Reported on 11/30/2015    . Cholecalciferol (VITAMIN D3) 5000 UNITS TABS Take 1 tablet by mouth every morning.    . citalopram (CELEXA) 20 MG tablet Take 20 mg by mouth 2 (two) times daily.    . clonazePAM  (KLONOPIN) 1 MG tablet Take 1 mg by mouth 3 (three) times daily. Take as directed    . ketotifen (ZADITOR) 0.025 % ophthalmic solution 1 drop 2 (two) times daily.    . meclizine (ANTIVERT) 25 MG tablet Take 25 mg by mouth  3 (three) times daily as needed for dizziness.    . metFORMIN (GLUCOPHAGE-XR) 500 MG 24 hr tablet TAKE 2 TABLETS BY MOUTH TWICE DAILY WITH A MEAL 180 tablet 0  . MONO-LINYAH 0.25-35 MG-MCG tablet TAKE 1 TABLET BY MOUTH DAILY. 28 tablet 11  . predniSONE (DELTASONE) 20 MG tablet Take one tab by mouth twice daily for 5 days, then one daily for 3 days. Take with food. 13 tablet 0  . promethazine (PHENERGAN) 25 MG tablet TAKE 1 TABLET BY MOUTH EVERY 6 HOURS AS NEEDED FOR NAUSEA. 42 tablet 2  . VENTOLIN HFA 108 (90 Base) MCG/ACT inhaler INHALE 2 PUFFS INTO THE LUNGS EVERY 4 HOURS AS NEEDED FOR WHEEZING OR SHORTNESS OF BREATH. 18 g 4   No current facility-administered medications on file prior to visit.    No Known Allergies Family History  Problem Relation Age of Onset  . Parkinsonism Father   . Alcohol abuse Neg Hx   . Depression Neg Hx   . Diabetes Neg Hx   . Hypertension Neg Hx   . Hyperlipidemia Neg Hx   . Heart attack Paternal Grandfather   . Cancer Paternal Grandmother    PE: BP (!) 135/104   Pulse (!) 101   Ht 5' 6.5" (1.689 m)   Wt (!) 345 lb (156.5 kg)   BMI 54.85 kg/m  Wt Readings from Last 3 Encounters:  08/10/16 (!) 345 lb (156.5 kg)  04/01/16 (!) 354 lb (160.6 kg)  03/24/16 (!) 354 lb 6.4 oz (160.8 kg)   Constitutional: obesity class III, in NAD, no full supraclavicular fat pads Eyes: PERRLA, EOMI, no exophthalmos ENT: moist mucous membranes, no thyromegaly, no cervical lymphadenopathy Cardiovascular: tachycardia, RR, No MRG Respiratory: CTA B Gastrointestinal: abdomen soft, NT, ND, BS+ Musculoskeletal: no deformities, strength intact in all 4 Skin: moist, warm, no rashes, + mild acanthosis nigricans on neck, + slightly darker terminal hairs on  chin Neurological: no tremor with outstretched hands, DTR normal in all 4  ASSESSMENT: 1. PCOSs  2. Prediabetes  3. Obesity class III BMI Classification:  < 18.5 underweight   18.5-24.9 normal weight   25.0-29.9 overweight   30.0-34.9 class I obesity   35.0-39.9 class II obesity   ? 40.0 class III obesity   PLAN:  1. PCOS - was doing well on LoLoestrin, but at last visit, her free testosterone was >ULN >> we switched to OrtoCyclen >> tolerating it well - we have discussed about the addition of spironolactone (however she is not very much bothered by her extra hair on chin, especially since this is blond).  - will recheck testosterone today - will add a vit D level and a Lipid panel  2. Patient with borderline prediabetes - Latest hemoglobin A1c level slightly above LLN for prediabetes >> we increased Metformin to 1000 mg bid >> latest HbA1c better, 5.6% - continue current dose of Metformin  - We will recheck her hemoglobin A1c  3. Obesity class 3 - lost 10 lbs recently by cutting down portions and eating only when hungry >> encouraged her to continue  Orders Placed This Encounter  Procedures  . Testosterone, Free, Total, SHBG  . Hemoglobin A1c  . Lipid panel  . VITAMIN D 25 Hydroxy (Vit-D Deficiency, Fractures)   Component     Latest Ref Rng & Units 08/10/2016  Cholesterol     0 - 200 mg/dL 161  Triglycerides     0.0 - 149.0 mg/dL 096.0 (H)  HDL Cholesterol     >  39.00 mg/dL 82.9550.10  VLDL     0.0 - 62.140.0 mg/dL 30.874.8 (H)  Total CHOL/HDL Ratio      3  NonHDL      116.61  Testosterone     8 - 48 ng/dL 38  Sex Horm Binding Glob, Serum     24.6 - 122.0 nmol/L 120.2  Testosterone Free     0.0 - 4.2 pg/mL 2.2  Hemoglobin A1C     4.6 - 6.5 % 6.0  VITD     30.00 - 100.00 ng/mL 63.48  Direct LDL     mg/dL 65.778.0   High Triglycerides and higher HbA1c. LDL normal. Vitamin D normal. Testosterone improved. Would encourage her to continue improving her diet, as she  already started.  Carlus Pavlovristina Heavenleigh Petruzzi, MD PhD [facility]

## 2016-08-11 ENCOUNTER — Other Ambulatory Visit: Payer: Self-pay

## 2016-08-11 LAB — TESTOSTERONE, FREE, TOTAL, SHBG
Sex Hormone Binding: 120.2 nmol/L (ref 24.6–122.0)
Testosterone, Free: 2.2 pg/mL (ref 0.0–4.2)
Testosterone: 38 ng/dL (ref 8–48)

## 2016-08-11 MED ORDER — METFORMIN HCL ER 500 MG PO TB24: ORAL_TABLET | ORAL | 0 refills | Status: DC

## 2016-08-12 MED FILL — METFORMIN HCL ER 500 MG TAB: 500 | 45 days supply | Qty: 180 | Fill #0

## 2016-08-13 MED FILL — CARVEDILOL 25 MG TABLET: 25 | 30 days supply | Qty: 60 | Fill #5

## 2016-08-23 ENCOUNTER — Other Ambulatory Visit: Payer: Self-pay | Admitting: Family Medicine

## 2016-08-23 MED FILL — ADVAIR 100/50 DISKUS: 100-50 | 30 days supply | Qty: 60 | Fill #0

## 2016-08-27 MED FILL — CITALOPRAM HBR 20 MG TABLET: 20 | 90 days supply | Qty: 180 | Fill #2

## 2016-08-27 MED FILL — busPIRone HCL 30 MG TABS: 30 | 90 days supply | Qty: 180 | Fill #2

## 2016-08-27 MED FILL — MONO-LINYAH 28 TABLET: 0.25-35 | 84 days supply | Qty: 84 | Fill #1

## 2016-09-02 MED FILL — CYCLOBENZAPRINE 10 MG TAB: 10 | 16 days supply | Qty: 50 | Fill #1

## 2016-09-09 MED FILL — clonazePAM 1 MG TABS: 1 | 90 days supply | Qty: 225 | Fill #0

## 2016-09-17 MED FILL — CARVEDILOL 25 MG TABLET: 25 | 90 days supply | Qty: 180 | Fill #6

## 2016-10-01 ENCOUNTER — Telehealth: Payer: Self-pay | Admitting: Internal Medicine

## 2016-10-01 ENCOUNTER — Other Ambulatory Visit: Payer: Self-pay | Admitting: Internal Medicine

## 2016-10-01 MED FILL — METFORMIN HCL ER 500 MG TAB: 500 | 90 days supply | Qty: 360 | Fill #0

## 2016-10-01 NOTE — Telephone Encounter (Signed)
Patient need a refill of medication metFORMIN (GLUCOPHAGE-XR) 500 MG 24 hr tablet  [facility] [place] [address] [phone] (Phone) [phone] (Fax)   Patient stated it was urgent. She need to pick it up today

## 2016-10-01 NOTE — Telephone Encounter (Signed)
I contacted the patient and advised we submitted the refill per request. Patient voiced understanding and had no further questions at this time.

## 2016-10-11 MED FILL — VENTOLIN HFA 90 MCG INHALER: 108 (90 BAS | 25 days supply | Qty: 18 | Fill #1

## 2016-10-22 MED FILL — ADVAIR 100/50 DISKUS: 100-50 | 30 days supply | Qty: 60 | Fill #1

## 2016-11-02 DIAGNOSIS — Z6841 Body Mass Index (BMI) 40.0 and over, adult: Secondary | ICD-10-CM | POA: Diagnosis not present

## 2016-11-02 DIAGNOSIS — Z01419 Encounter for gynecological examination (general) (routine) without abnormal findings: Secondary | ICD-10-CM | POA: Diagnosis not present

## 2016-11-12 MED FILL — MONO-LINYAH 28 TABLET: 0.25-35 | 84 days supply | Qty: 84 | Fill #2

## 2016-11-16 MED FILL — CYCLOBENZAPRINE 10 MG TAB: 10 | 17 days supply | Qty: 50 | Fill #0

## 2016-12-03 MED FILL — busPIRone HCL 30 MG TABS: 30 | 90 days supply | Qty: 180 | Fill #3

## 2016-12-03 MED FILL — CITALOPRAM HBR 20 MG TABLET: 20 | 90 days supply | Qty: 180 | Fill #3

## 2016-12-09 MED FILL — clonazePAM 1 MG TABS: 1 | 90 days supply | Qty: 225 | Fill #0

## 2016-12-17 MED FILL — CARVEDILOL 25 MG TABLET: 25 | 90 days supply | Qty: 180 | Fill #7

## 2016-12-24 MED FILL — ADVAIR 100/50 DISKUS: 100-50 | 30 days supply | Qty: 60 | Fill #2

## 2016-12-31 MED FILL — METFORMIN HCL ER 500 MG TAB: 500 | 90 days supply | Qty: 360 | Fill #1

## 2017-01-01 ENCOUNTER — Encounter: Payer: Self-pay | Admitting: Emergency Medicine

## 2017-01-01 ENCOUNTER — Emergency Department
Admission: EM | Admit: 2017-01-01 | Discharge: 2017-01-01 | Disposition: A | Discharge status: Home / Self Care | Payer: 59 | Source: Home / Self Care | Attending: Family Medicine | Admitting: Family Medicine

## 2017-01-01 DIAGNOSIS — J069 Acute upper respiratory infection, unspecified: Secondary | ICD-10-CM

## 2017-01-01 DIAGNOSIS — B9789 Other viral agents as the cause of diseases classified elsewhere: Secondary | ICD-10-CM | POA: Diagnosis not present

## 2017-01-01 MED ORDER — PREDNISONE 20 MG PO TABS: ORAL_TABLET | ORAL | 0 refills | Status: DC

## 2017-01-01 MED ORDER — CEFDINIR 300 MG PO CAPS: 300.0000 mg | ORAL_CAPSULE | Freq: Two times a day (BID) | ORAL | 0 refills | Status: DC

## 2017-01-01 NOTE — Discharge Instructions (Signed)
Take plain guaifenesin (  extended release tabs such as Mucinex) twice daily, with plenty of water, for cough and congestion.  Get adequate rest.   May use Afrin nasal spray (or generic oxymetazoline) twice daily for about 5 days and then discontinue.  Also recommend using saline nasal spray several times daily and saline nasal irrigation (AYR is a common brand).  Use Flonase nasal spray each morning after using Afrin nasal spray and saline nasal irrigation. Try warm salt water gargles for sore throat.  Stop all antihistamines for now, and other non-prescription cough/cold preparations. May take Delsym Cough Suppressant at bedtime for nighttime cough.  Increase Advair to two times daily.  Continue albuterol inhaler as needed.

## 2017-01-01 NOTE — ED Triage Notes (Signed)
Pt has a history of chronic sinus infections, fatigue feeling, getting more winded easily, using Albuterol inhaler more frequently, post nasal drip, cough more at night when laying down, afebrile, no ear pain, no runny nose.

## 2017-01-01 NOTE — ED Provider Notes (Signed)
[facility]    CSN: [id number] Arrival date & time: 01/01/17  1447     History   Chief Complaint Chief Complaint  Patient presents with  . URI    HPI Amanda Massey is a 36 y.o. female.   Patient complains of four day history of sinus pressure, hoarse, fatigue, neck soreness, mild myalgias, and mild cough.  She has a history of asthma, and has had to use her albuterol inhaler more frequently.  She has a history of recurring sinusitis.   The history is provided by the patient.    Past Medical History:  Diagnosis Date  . Allergy   . Anxiety   . Asthma   . Back pain    tx with OTC meds  . Depression   . Hypertension    no rx meds  . Obesity   . PCOS (polycystic ovarian syndrome)   . Prediabetes   . Seasonal allergies   . Vertigo     Patient Active Problem List   Diagnosis Date Noted  . Palpitations 03/24/2016  . PVC (premature ventricular contraction) 03/24/2016  . SOB (shortness of breath) 03/24/2016  . Hypertensive heart disease without heart failure 03/24/2016  . Breast skin changes 02/20/2016  . Elevated TSH 06/17/2015  . Essential hypertension 09/11/2014  . LVH (left ventricular hypertrophy) 08/16/2014  . Morbid obesity (HCC) 08/16/2014  . Tachycardia 07/05/2014  . Prediabetes 12/07/2012  . Anxiety and depression 07/07/2011  . PCOS (polycystic ovarian syndrome) 07/07/2011  . Vertigo 07/07/2011  . Asthma, intermittent 07/07/2011    Past Surgical History:  Procedure Laterality Date  . DILATION AND EVACUATION  03/17/2012   Procedure: DILATATION AND EVACUATION;  Surgeon: Jeani Hawking, MD;  Location: [facility];  Service: Gynecology;  Laterality: N/A;  . MULTIPLE TOOTH EXTRACTIONS     for braces  . svd     x 1  . WISDOM TOOTH EXTRACTION      OB History    Gravida Para Term Preterm AB Living   2 1 1   1 1    SAB TAB Ectopic Multiple Live Births   1               Home Medications    Prior to Admission medications     Medication Sig Start Date End Date Taking? Authorizing Provider  ADVAIR DISKUS 100-50 MCG/DOSE AEPB INHALE 1 PUFF BY MOUTH TWICE DAILY 08/23/16   Doreene Eland, MD  busPIRone (BUSPAR) 30 MG tablet Take 15 mg by mouth 2 (two) times daily.     Historical Provider, MD  carvedilol (COREG) 25 MG tablet Take 1 tablet (25 mg total) by mouth 2 (two) times daily with a meal. 03/26/16   Lars Masson, MD  cefdinir (OMNICEF) 300 MG capsule Take 1 capsule (300 mg total) by mouth 2 (two) times daily. 01/01/17   Lattie Haw, MD  cetirizine (ZYRTEC) 10 MG tablet Take 10 mg by mouth daily. Reported on 11/30/2015    Historical Provider, MD  citalopram (CELEXA) 20 MG tablet Take 20 mg by mouth 2 (two) times daily.    Historical Provider, MD  clonazePAM (KLONOPIN) 1 MG tablet Take 1 mg by mouth 3 (three) times daily. Take as directed    Historical Provider, MD  metFORMIN (GLUCOPHAGE-XR) 500 MG 24 hr tablet TAKE 2 TABLETS BY MOUTH TWICE DAILY WITH A MEAL 10/01/16   Carlus Pavlov, MD  MONO-LINYAH 0.25-35 MG-MCG tablet TAKE 1 TABLET BY MOUTH DAILY. 06/07/16  Carlus Pavlovristina Gherghe, MD  predniSONE (DELTASONE) 20 MG tablet Take one tab by mouth twice daily for 5 days, then one daily. Take with food. 01/01/17   Lattie HawStephen A Veatrice Eckstein, MD  VENTOLIN HFA 108 (90 Base) MCG/ACT inhaler INHALE 2 PUFFS INTO THE LUNGS EVERY 4 HOURS AS NEEDED FOR WHEEZING OR SHORTNESS OF BREATH. 06/22/16   Doreene ElandKehinde T Eniola, MD    Family History Family History  Problem Relation Age of Onset  . Parkinsonism Father   . Heart attack Paternal Grandfather   . Cancer Paternal Grandmother   . Alcohol abuse Neg Hx   . Depression Neg Hx   . Diabetes Neg Hx   . Hypertension Neg Hx   . Hyperlipidemia Neg Hx     Social History Social History  Substance Use Topics  . Smoking status: Never Smoker  . Smokeless tobacco: Never Used  . Alcohol use No     Allergies   Patient has no known allergies.   Review of Systems Review of Systems ? sore  throat + hoarse + cough No pleuritic pain ? wheezing + nasal congestion + post-nasal drainage + sinus pain/pressure No itchy/red eyes No earache No hemoptysis No SOB No fever, + chills No nausea No vomiting No abdominal pain No diarrhea No urinary symptoms No skin rash + fatigue + myalgias + headache Used OTC meds without relief   Physical Exam Triage Vital Signs ED Triage Vitals  Enc Vitals Group     BP 01/01/17 1518 141/86     Pulse Rate 01/01/17 1518 91     Resp --      Temp 01/01/17 1518 98 F (36.7 C)     Temp Source 01/01/17 1518 Oral     SpO2 01/01/17 1518 95 %     Weight 01/01/17 1518 (!) 355 lb 4 oz (161.1 kg)     Height 01/01/17 1518 5\' 6"  (1.676 m)     Head Circumference --      Peak Flow --      Pain Score 01/01/17 1520 0     Pain Loc --      Pain Edu? --      Excl. in GC? --    No data found.   Updated Vital Signs BP 141/86 (BP Location: Left Arm)   Pulse 91   Temp 98 F (36.7 C) (Oral)   Ht 5\' 6"  (1.676 m)   Wt (!) 355 lb 4 oz (161.1 kg)   LMP 12/16/2016 (Exact Date)   SpO2 95%   BMI 57.34 kg/m   Visual Acuity Right Eye Distance:   Left Eye Distance:   Bilateral Distance:    Right Eye Near:   Left Eye Near:    Bilateral Near:     Physical Exam Nursing notes and Vital Signs reviewed. Appearance:  Patient appears stated age, and in no acute distress Eyes:  Pupils are equal, round, and reactive to light and accomodation.  Extraocular movement is intact.  Conjunctivae are not inflamed  Ears:  Canals normal.  Tympanic membranes normal.  Nose:  Congested turbinates.  No sinus tenderness.    Pharynx:  Normal Neck:  Supple.  Tender enlarged posterior/lateral nodes are palpated bilaterally  Lungs:  Clear to auscultation.  Breath sounds are equal.  Moving air well. Heart:  Regular rate and rhythm without murmurs, rubs, or gallops.  Abdomen:  Nontender without masses or hepatosplenomegaly.  Bowel sounds are present.  No CVA or flank  tenderness.  Extremities:  No edema.  Skin:  No rash present.    UC Treatments / Results  Labs (all labs ordered are listed, but only abnormal results are displayed) Labs Reviewed - No data to display  EKG  EKG Interpretation None       Radiology No results found.  Procedures Procedures (including critical care time)  Medications Ordered in UC Medications - No data to display   Initial Impression / Assessment and Plan / UC Course  I have reviewed the triage vital signs and the nursing notes.  Pertinent labs & imaging results that were available during my care of the patient were reviewed by me and considered in my medical decision making (see chart for details).    With her history of Asthma and frequent sinusitis, will begin prednisone burst/taper and Omnicef. Take plain guaifenesin (1200mg  extended release tabs such as Mucinex) twice daily, with plenty of water, for cough and congestion.  Get adequate rest.   May use Afrin nasal spray (or generic oxymetazoline) twice daily for about 5 days and then discontinue.  Also recommend using saline nasal spray several times daily and saline nasal irrigation (AYR is a common brand).  Use Flonase nasal spray each morning after using Afrin nasal spray and saline nasal irrigation. Try warm salt water gargles for sore throat.  Stop all antihistamines for now, and other non-prescription cough/cold preparations. May take Delsym Cough Suppressant at bedtime for nighttime cough.  Increase Advair to two times daily.  Continue albuterol inhaler as needed. Followup with Family Doctor if not improved in 10 days.    Final Clinical Impressions(s) / UC Diagnoses   Final diagnoses:  Viral URI with cough    New Prescriptions New Prescriptions   CEFDINIR (OMNICEF) 300 MG CAPSULE    Take 1 capsule (300 mg total) by mouth 2 (two) times daily.   PREDNISONE (DELTASONE) 20 MG TABLET    Take one tab by mouth twice daily for 5 days, then one daily.  Take with food.     Lattie Haw, MD 01/06/17 [phone]

## 2017-01-10 ENCOUNTER — Emergency Department (HOSPITAL_BASED_OUTPATIENT_CLINIC_OR_DEPARTMENT_OTHER): Payer: 59

## 2017-01-10 ENCOUNTER — Emergency Department (HOSPITAL_BASED_OUTPATIENT_CLINIC_OR_DEPARTMENT_OTHER)
Admission: EM | Admit: 2017-01-10 | Discharge: 2017-01-10 | Disposition: A | Discharge status: Home / Self Care | Payer: 59 | Attending: Emergency Medicine | Admitting: Emergency Medicine

## 2017-01-10 ENCOUNTER — Encounter (HOSPITAL_BASED_OUTPATIENT_CLINIC_OR_DEPARTMENT_OTHER): Payer: Self-pay | Admitting: Emergency Medicine

## 2017-01-10 DIAGNOSIS — Z7984 Long term (current) use of oral hypoglycemic drugs: Secondary | ICD-10-CM | POA: Insufficient documentation

## 2017-01-10 DIAGNOSIS — R42 Dizziness and giddiness: Secondary | ICD-10-CM | POA: Diagnosis not present

## 2017-01-10 DIAGNOSIS — I119 Hypertensive heart disease without heart failure: Secondary | ICD-10-CM | POA: Diagnosis not present

## 2017-01-10 DIAGNOSIS — Z79899 Other long term (current) drug therapy: Secondary | ICD-10-CM | POA: Diagnosis not present

## 2017-01-10 DIAGNOSIS — R519 Headache, unspecified: Secondary | ICD-10-CM

## 2017-01-10 DIAGNOSIS — R51 Headache: Secondary | ICD-10-CM | POA: Insufficient documentation

## 2017-01-10 DIAGNOSIS — J45909 Unspecified asthma, uncomplicated: Secondary | ICD-10-CM | POA: Diagnosis not present

## 2017-01-10 LAB — CBC WITH DIFFERENTIAL/PLATELET
BASOS ABS: 0 10*3/uL (ref 0.0–0.1)
BASOS PCT: 0 %
EOS ABS: 0.3 10*3/uL (ref 0.0–0.7)
Eosinophils Relative: 2 %
HCT: 38.1 % (ref 36.0–46.0)
HEMOGLOBIN: 12 g/dL (ref 12.0–15.0)
Lymphocytes Relative: 38 %
Lymphs Abs: 5.8 10*3/uL — ABNORMAL HIGH (ref 0.7–4.0)
MCH: 25.6 pg — ABNORMAL LOW (ref 26.0–34.0)
MCHC: 31.5 g/dL (ref 30.0–36.0)
MCV: 81.4 fL (ref 78.0–100.0)
MONOS PCT: 8 %
Monocytes Absolute: 1.2 10*3/uL — ABNORMAL HIGH (ref 0.1–1.0)
NEUTROS PCT: 52 %
Neutro Abs: 8 10*3/uL — ABNORMAL HIGH (ref 1.7–7.7)
Platelets: 584 10*3/uL — ABNORMAL HIGH (ref 150–400)
RBC: 4.68 MIL/uL (ref 3.87–5.11)
RDW: 15.5 % (ref 11.5–15.5)
WBC: 15.3 10*3/uL — ABNORMAL HIGH (ref 4.0–10.5)

## 2017-01-10 LAB — PREGNANCY, URINE: PREG TEST UR: NEGATIVE

## 2017-01-10 MED ORDER — DIPHENHYDRAMINE HCL 50 MG/ML IJ SOLN
25.0000 mg | Freq: Once | INTRAMUSCULAR | Status: AC
  Administered 2017-01-10: 25 mg via INTRAVENOUS
  Filled 2017-01-10: qty 1

## 2017-01-10 MED ORDER — KETOROLAC TROMETHAMINE 30 MG/ML IJ SOLN
30.0000 mg | Freq: Once | INTRAMUSCULAR | Status: AC
Start: 2017-01-10 — End: 2017-01-10
  Administered 2017-01-10: 30 mg via INTRAVENOUS
  Filled 2017-01-10: qty 1

## 2017-01-10 MED ORDER — METOCLOPRAMIDE HCL 5 MG/ML IJ SOLN
10.0000 mg | Freq: Once | INTRAMUSCULAR | Status: AC
  Administered 2017-01-10: 10 mg via INTRAVENOUS
  Filled 2017-01-10: qty 2

## 2017-01-10 MED ORDER — SODIUM CHLORIDE 0.9 % IV BOLUS (SEPSIS)
1000.0000 mL | Freq: Once | INTRAVENOUS | Status: AC
  Administered 2017-01-10: 1000 mL via INTRAVENOUS

## 2017-01-10 NOTE — ED Triage Notes (Addendum)
Pt c/o constant headache x3 days. Reports a little bit of dizziness and pain in the right eye but no pain at this time. Nausea when pain is severe. States she had a cold last week and is still on the antibiotic but completed course of prednisone. Denies fever or photosensitivity. Took ibuprofen yesterday.

## 2017-01-10 NOTE — ED Notes (Addendum)
Pt verbalized understanding of discharge instructions and denies any further questions at this time.    PA at beside discussing discharge instructions.

## 2017-01-10 NOTE — Discharge Instructions (Signed)
Please rest for the remainder of the day Take NSAIDs if headache worsened Return if headache becomes severe again or you develop other worsening symptoms Follow up with PCP

## 2017-01-10 NOTE — ED Provider Notes (Signed)
[facility]   CSN: [id number] Arrival date & time: 01/10/17  1028     History   Chief Complaint Chief Complaint  Patient presents with  . Headache    HPI Amanda Massey is a 36 y.o. female who presents with headache. PMH significant for vertigo, obesity, recent sinusitis. It is across the frontal region of her head. It is constant, nonradiating. It started 3 days ago and gradually worsened. It is the worse headache of her life and different from prior headaches which were mild and better with Ibuprofen. Reports associated intermittent dizziness, nausea. Also reports generalized weakness and a feeling of "heaviness". Had an episode of blurry vision and sharp right eye pain yesterday which resolved on its own. Moving around makes it worse. Has had recent sinusitis and is currently on antibiotics (Cefdinir) and completed course of steroids and states her symptoms from this are better. Denies fever, photosensitivity, LOC, trauma, neck stiffness, acute onset.  HPI  Past Medical History:  Diagnosis Date  . Allergy   . Anxiety   . Asthma   . Back pain    tx with OTC meds  . Depression   . Hypertension    no rx meds  . Obesity   . PCOS (polycystic ovarian syndrome)   . Prediabetes   . Seasonal allergies   . Vertigo     Patient Active Problem List   Diagnosis Date Noted  . Palpitations 03/24/2016  . PVC (premature ventricular contraction) 03/24/2016  . SOB (shortness of breath) 03/24/2016  . Hypertensive heart disease without heart failure 03/24/2016  . Breast skin changes 02/20/2016  . Elevated TSH 06/17/2015  . Essential hypertension 09/11/2014  . LVH (left ventricular hypertrophy) 08/16/2014  . Morbid obesity (HCC) 08/16/2014  . Tachycardia 07/05/2014  . Prediabetes 12/07/2012  . Anxiety and depression 07/07/2011  . PCOS (polycystic ovarian syndrome) 07/07/2011  . Vertigo 07/07/2011  . Asthma, intermittent 07/07/2011    Past Surgical  History:  Procedure Laterality Date  . DILATION AND EVACUATION  03/17/2012   Procedure: DILATATION AND EVACUATION;  Surgeon: Jeani HawkingMichelle L Grewal, MD;  Location: [facility];  Service: Gynecology;  Laterality: N/A;  . MULTIPLE TOOTH EXTRACTIONS     for braces  . svd     x 1  . WISDOM TOOTH EXTRACTION      OB History    Gravida Para Term Preterm AB Living   2 1 1   1 1    SAB TAB Ectopic Multiple Live Births   1               Home Medications    Prior to Admission medications   Medication Sig Start Date End Date Taking? Authorizing Provider  ADVAIR DISKUS 100-50 MCG/DOSE AEPB INHALE 1 PUFF BY MOUTH TWICE DAILY 08/23/16  Yes Doreene ElandKehinde T Eniola, MD  busPIRone (BUSPAR) 30 MG tablet Take 15 mg by mouth 2 (two) times daily.    Yes Historical Provider, MD  carvedilol (COREG) 25 MG tablet Take 1 tablet (25 mg total) by mouth 2 (two) times daily with a meal. 03/26/16  Yes Lars MassonKatarina H Nelson, MD  cefdinir (OMNICEF) 300 MG capsule Take 1 capsule (300 mg total) by mouth 2 (two) times daily. 01/01/17  Yes Lattie HawStephen A Beese, MD  cetirizine (ZYRTEC) 10 MG tablet Take 10 mg by mouth daily. Reported on 11/30/2015   Yes Historical Provider, MD  citalopram (CELEXA) 20 MG tablet Take 20 mg by mouth 2 (two) times daily.  Yes Historical Provider, MD  clonazePAM (KLONOPIN) 1 MG tablet Take 1 mg by mouth 3 (three) times daily. Take as directed   Yes Historical Provider, MD  metFORMIN (GLUCOPHAGE-XR) 500 MG 24 hr tablet TAKE 2 TABLETS BY MOUTH TWICE DAILY WITH A MEAL 10/01/16  Yes Carlus Pavlov, MD  MONO-LINYAH 0.25-35 MG-MCG tablet TAKE 1 TABLET BY MOUTH DAILY. 06/07/16  Yes Carlus Pavlov, MD  predniSONE (DELTASONE) 20 MG tablet Take one tab by mouth twice daily for 5 days, then one daily. Take with food. 01/01/17   Lattie Haw, MD  VENTOLIN HFA 108 (90 Base) MCG/ACT inhaler INHALE 2 PUFFS INTO THE LUNGS EVERY 4 HOURS AS NEEDED FOR WHEEZING OR SHORTNESS OF BREATH. 06/22/16   Doreene Eland, MD    Family  History Family History  Problem Relation Age of Onset  . Parkinsonism Father   . Heart attack Paternal Grandfather   . Cancer Paternal Grandmother   . Alcohol abuse Neg Hx   . Depression Neg Hx   . Diabetes Neg Hx   . Hypertension Neg Hx   . Hyperlipidemia Neg Hx     Social History Social History  Substance Use Topics  . Smoking status: Never Smoker  . Smokeless tobacco: Never Used  . Alcohol use No     Allergies   Patient has no known allergies.   Review of Systems Review of Systems  Constitutional: Negative for fever.  HENT: Negative for sinus pain and sinus pressure.   Eyes: Positive for pain (resolved) and visual disturbance (blurriness - resolved). Negative for photophobia.  Musculoskeletal: Negative for neck pain.  Neurological: Positive for dizziness, light-headedness and headaches. Negative for syncope, weakness and numbness.     Physical Exam Updated Vital Signs BP (!) 154/102 (BP Location: Right Wrist)   Pulse 113   Temp 98.1 F (36.7 C) (Oral)   Resp 16   Ht 5\' 6"  (1.676 m)   Wt (!) 161 kg   LMP 12/16/2016 (Exact Date)   SpO2 96%   BMI 57.30 kg/m   Physical Exam  Constitutional: She is oriented to person, place, and time. She appears well-developed and well-nourished. No distress.  Obese, NAD  HENT:  Head: Normocephalic and atraumatic.  Right Ear: Hearing, external ear and ear canal normal.  Left Ear: Hearing, tympanic membrane, external ear and ear canal normal.  Cerumen impaction on right side  Eyes: Conjunctivae are normal. Pupils are equal, round, and reactive to light. Right eye exhibits no discharge. Left eye exhibits no discharge. No scleral icterus.  Neck: Normal range of motion.  Cardiovascular: Normal rate.   Pulmonary/Chest: Effort normal. No respiratory distress.  Abdominal: She exhibits no distension.  Neurological: She is alert and oriented to person, place, and time.  Mental Status:  Alert, oriented, thought content  appropriate, able to give a coherent history. Speech fluent without evidence of aphasia. Able to follow 2 step commands without difficulty.  Cranial Nerves:  II:  Peripheral visual fields grossly normal, pupils equal, round, reactive to light III,IV, VI: ptosis not present, extra-ocular motions intact bilaterally  V,VII: smile symmetric, facial light touch sensation equal VIII: hearing grossly normal to voice  X: uvula elevates symmetrically  XI: bilateral shoulder shrug symmetric and strong XII: midline tongue extension without fassiculations Motor:  Normal tone. 5/5 in upper and lower extremities bilaterally including strong and equal grip strength and dorsiflexion/plantar flexion Sensory: Pinprick and light touch normal in all extremities.  Cerebellar: normal finger-to-nose with bilateral upper extremities Gait:  normal gait and balance CV: distal pulses palpable throughout    Skin: Skin is warm and dry.  Psychiatric: She has a normal mood and affect. Her behavior is normal.  Nursing note and vitals reviewed.    ED Treatments / Results  Labs (all labs ordered are listed, but only abnormal results are displayed) Labs Reviewed  CBC WITH DIFFERENTIAL/PLATELET - Abnormal; Notable for the following:       Result Value   WBC 15.3 (*)    MCH 25.6 (*)    Platelets 584 (*)    Neutro Abs 8.0 (*)    Lymphs Abs 5.8 (*)    Monocytes Absolute 1.2 (*)    All other components within normal limits  PREGNANCY, URINE  BASIC METABOLIC PANEL    EKG  EKG Interpretation None       Radiology Ct Head Wo Contrast  Result Date: 01/10/2017 CLINICAL DATA:  Frontal headache, dizziness starting 01/07/2017, no known injury EXAM: CT HEAD WITHOUT CONTRAST TECHNIQUE: Contiguous axial images were obtained from the base of the skull through the vertex without intravenous contrast. COMPARISON:  08/11/2014 FINDINGS: Brain: No intracranial hemorrhage, mass effect or midline shift. No acute cortical  infarction. No mass lesion is noted on this unenhanced scan. No hydrocephalus. Ventricular size is stable from prior exam. The gray and white-matter differentiation is preserved. No intra or extra-axial fluid collection. Vascular: No hyperdense vessel or unexpected calcification. Skull: Normal. Negative for fracture or focal lesion. Sinuses/Orbits: No acute finding. Other: None. IMPRESSION: No acute intracranial abnormality.  No significant change. Electronically Signed   By: Natasha Mead M.D.   On: 01/10/2017 11:41    Procedures Procedures (including critical care time)  Medications Ordered in ED Medications  ketorolac (TORADOL) 30 MG/ML injection 30 mg (30 mg Intravenous Given 01/10/17 1205)  sodium chloride 0.9 % bolus 1,000 mL (0 mLs Intravenous Stopped 01/10/17 1351)  metoCLOPramide (REGLAN) injection 10 mg (10 mg Intravenous Given 01/10/17 1205)  diphenhydrAMINE (BENADRYL) injection 25 mg (25 mg Intravenous Given 01/10/17 1205)     Initial Impression / Assessment and Plan / ED Course  I have reviewed the triage vital signs and the nursing notes.  Pertinent labs & imaging results that were available during my care of the patient were reviewed by me and considered in my medical decision making (see chart for details).  36 year old female presents with headache - likely migraine vs tension. After IVF, toradol, reglan, benadryl she feels improved. CT head negative. CBC remarkable for leukocytosis most likely due to recent steroid use. BMP unfortunately was clotted. Do not feel this test will change management therefore will d/c with return precautions.  Final Clinical Impressions(s) / ED Diagnoses   Final diagnoses:  Bad headache    New Prescriptions New Prescriptions   No medications on file     Bethel Born, PA-C 01/10/17 1355    Pricilla Loveless, MD 01/10/17 [phone]

## 2017-01-13 DIAGNOSIS — H5212 Myopia, left eye: Secondary | ICD-10-CM | POA: Diagnosis not present

## 2017-01-13 DIAGNOSIS — H52223 Regular astigmatism, bilateral: Secondary | ICD-10-CM | POA: Diagnosis not present

## 2017-02-04 MED FILL — VENTOLIN HFA 90 MCG INHALER: 108 (90 BAS | 25 days supply | Qty: 18 | Fill #2

## 2017-02-04 MED FILL — MONO-LINYAH 28 TABLET: 0.25-35 | 84 days supply | Qty: 84 | Fill #3

## 2017-02-04 MED FILL — CYCLOBENZAPRINE 10 MG TAB: 10 | 17 days supply | Qty: 50 | Fill #1

## 2017-02-17 MED FILL — ADVAIR 100/50 DISKUS: 100-50 | 30 days supply | Qty: 60 | Fill #3

## 2017-03-04 MED FILL — CITALOPRAM HBR 20 MG TABLET: 20 | 90 days supply | Qty: 180 | Fill #0

## 2017-03-04 MED FILL — busPIRone HCL 30 MG TABS: 30 | 90 days supply | Qty: 180 | Fill #0

## 2017-03-08 ENCOUNTER — Encounter: Payer: Self-pay | Admitting: Cardiology

## 2017-03-08 DIAGNOSIS — R000 Tachycardia, unspecified: Secondary | ICD-10-CM

## 2017-03-10 NOTE — Telephone Encounter (Signed)
Amanda Massey,  Please schedule a 24 hour Holter and a repeat echo and schedule her appointment with me sometimes after she has those tests.  Thank you,  K                                       Sent the pt a mychart message back with recommendations as provided above, from Dr Delton See.  Sent a message to the pt that someone from our scheduling dept will be calling her back to arrange all appointments mentioned.

## 2017-03-11 MED FILL — clonazePAM 1 MG TABS: 1 | 90 days supply | Qty: 225 | Fill #0

## 2017-03-15 ENCOUNTER — Telehealth: Payer: Self-pay | Admitting: *Deleted

## 2017-03-15 NOTE — Telephone Encounter (Signed)
Pts 24 hour holter and echo scheduled for 04/21/17 and follow-up appt with Dr Delton See scheduled for 04/28/17.  Pt made aware of appts by [facility] scheduling.

## 2017-03-15 NOTE — Telephone Encounter (Signed)
-----   Message from Clarita Leber sent at 03/15/2017 11:49 AM EDT ----- Regarding: RE: 24 hr holter, echo, and follow-up appt per Dr Delton See We have called pt 04/26 04/30 and today no response. Just letting ya know.   Lamar Laundry   ----- Message ----- From: Loa Socks, LPN Sent: 1/61/0960   3:31 PM To: [facility] Subject: FW: 24 hr holter, echo, and follow-up appt p#    ----- Message ----- From: Loa Socks, LPN Sent: 4/54/0981   1:06 PM To: [facility] Subject: 24 hr holter, echo, and follow-up appt per D#  Per Dr Delton See, this pt needs to be scheduled for a 24 hr holter monitor, a repeat echo, and a follow-up appt with her thereafter, once testing done.  Pt is aware that you guys will be calling her back to arrange all 3 appts.  Orders in the system.  Can you call and arrange? This is for pt c/o tachycardia.   Thanks ladies,   Lajoyce Corners

## 2017-03-18 ENCOUNTER — Other Ambulatory Visit: Payer: Self-pay | Admitting: Cardiology

## 2017-03-18 MED FILL — CARVEDILOL 25 MG TABLET: 25 | 30 days supply | Qty: 60 | Fill #0

## 2017-03-24 ENCOUNTER — Other Ambulatory Visit: Payer: Self-pay | Admitting: Internal Medicine

## 2017-03-25 ENCOUNTER — Encounter: Payer: Self-pay | Admitting: Family Medicine

## 2017-03-25 MED FILL — METFORMIN HCL ER 500 MG TAB: 500 | 45 days supply | Qty: 180 | Fill #0

## 2017-04-05 MED FILL — CITALOPRAM HBR 40 MG TABLET: 40 | 30 days supply | Qty: 45 | Fill #0

## 2017-04-07 ENCOUNTER — Other Ambulatory Visit: Payer: Self-pay | Admitting: Internal Medicine

## 2017-04-08 ENCOUNTER — Ambulatory Visit: Payer: Self-pay | Admitting: Family Medicine

## 2017-04-11 ENCOUNTER — Encounter: Payer: Self-pay | Admitting: Family Medicine

## 2017-04-12 ENCOUNTER — Ambulatory Visit: Payer: 59 | Admitting: Family Medicine

## 2017-04-12 MED FILL — MONO-LINYAH 28 TABLET: 0.25-35 | 84 days supply | Qty: 84 | Fill #0

## 2017-04-15 ENCOUNTER — Other Ambulatory Visit (HOSPITAL_COMMUNITY): Payer: Self-pay

## 2017-04-15 ENCOUNTER — Ambulatory Visit: Payer: 59 | Admitting: Family Medicine

## 2017-04-15 MED FILL — CARVEDILOL 25 MG TABLET: 25 | 30 days supply | Qty: 60 | Fill #1

## 2017-04-21 ENCOUNTER — Ambulatory Visit (HOSPITAL_COMMUNITY): Payer: 59 | Attending: Cardiology

## 2017-04-21 ENCOUNTER — Other Ambulatory Visit: Payer: Self-pay

## 2017-04-21 ENCOUNTER — Ambulatory Visit (INDEPENDENT_AMBULATORY_CARE_PROVIDER_SITE_OTHER): Payer: 59

## 2017-04-21 DIAGNOSIS — R000 Tachycardia, unspecified: Secondary | ICD-10-CM | POA: Diagnosis not present

## 2017-04-22 MED FILL — ADVAIR 100/50 DISKUS: 100-50 | 30 days supply | Qty: 60 | Fill #4

## 2017-04-28 ENCOUNTER — Telehealth: Payer: Self-pay | Admitting: Cardiology

## 2017-04-28 ENCOUNTER — Ambulatory Visit: Payer: Self-pay | Admitting: Cardiology

## 2017-04-28 NOTE — Telephone Encounter (Signed)
Patient calling, states that she would like to know if it would be okay not to come in for a f/u appt? Patient states that she already has her results and states that it is hard for her to get away. Thanks.

## 2017-04-28 NOTE — Telephone Encounter (Signed)
Pt was scheduled to see Dr Delton SeeNelson today at 1140.  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  Dr Delton SeeNelson has done recent test on her which were normal with no new med changes made.  Per Dr Delton SeeNelson, the pt can follow-up as needed.  Left a detailed message on the pts confirmed VM that per Dr Delton SeeNelson, she can follow-up as needed.  Left a detailed message for the pt to call back with any additional questions regarding message left.

## 2017-05-10 ENCOUNTER — Telehealth: Payer: Self-pay | Admitting: Cardiology

## 2017-05-10 NOTE — Telephone Encounter (Signed)
Called and spoke with patient, she is aware of results and states that she is not having any more symptoms with the palpitations, she is drinking more water and trying to exercise. She states that she feels as though she does not need medication at this time.

## 2017-05-10 NOTE — Telephone Encounter (Signed)
New message    [facility] She would like a call back she has a question. She said she is at work and will not be available to answer until about 1240.

## 2017-05-13 ENCOUNTER — Other Ambulatory Visit: Payer: Self-pay

## 2017-05-13 MED ORDER — METFORMIN HCL ER 500 MG PO TB24: 1000.0000 mg | ORAL_TABLET | Freq: Two times a day (BID) | ORAL | 3 refills | Status: DC

## 2017-05-13 MED FILL — METFORMIN HCL ER 500 MG TAB: 500 | 90 days supply | Qty: 360 | Fill #1

## 2017-05-16 ENCOUNTER — Other Ambulatory Visit: Payer: Self-pay

## 2017-05-16 MED ORDER — METFORMIN HCL ER 500 MG PO TB24: 500.0000 mg | ORAL_TABLET | Freq: Two times a day (BID) | ORAL | 1 refills | Status: DC

## 2017-05-20 ENCOUNTER — Other Ambulatory Visit: Payer: Self-pay | Admitting: Cardiology

## 2017-05-20 MED FILL — CARVEDILOL 25 MG TABLET: 25 | 30 days supply | Qty: 60 | Fill #0

## 2017-05-23 MED FILL — HYDROCODON-APAP 5-325: 5-325 | 12 days supply | Qty: 50 | Fill #0

## 2017-05-24 MED FILL — CYCLOBENZAPRINE 10 MG TAB: 10 | 17 days supply | Qty: 50 | Fill #0

## 2017-06-02 MED FILL — busPIRone HCL 30 MG TABS: 30 | 90 days supply | Qty: 180 | Fill #1

## 2017-06-02 MED FILL — VENTOLIN HFA 90 MCG INHALER: 108 (90 BAS | 25 days supply | Qty: 18 | Fill #3

## 2017-06-02 MED FILL — CITALOPRAM HBR 40 MG TABLET: 40 | 30 days supply | Qty: 45 | Fill #1

## 2017-06-10 MED FILL — clonazePAM 1 MG TABS: 1 | 90 days supply | Qty: 225 | Fill #0

## 2017-06-23 MED FILL — CARVEDILOL 25 MG TABLET: 25 | 30 days supply | Qty: 60 | Fill #1

## 2017-06-23 MED FILL — ADVAIR 100/50 DISKUS: 100-50 | 30 days supply | Qty: 60 | Fill #5

## 2017-07-01 MED FILL — CITALOPRAM HBR 40 MG TABLET: 40 | 90 days supply | Qty: 135 | Fill #0

## 2017-07-20 MED FILL — CYCLOBENZAPRINE 10 MG TAB: 10 | 17 days supply | Qty: 50 | Fill #0

## 2017-07-20 MED FILL — HYDROCODON-APAP 5-325: 5-325 | 17 days supply | Qty: 50 | Fill #0

## 2017-07-22 MED FILL — NORG-ETHIN ESTRA 0.25-0.035: 0.25-35 | 84 days supply | Qty: 84 | Fill #1

## 2017-07-26 ENCOUNTER — Other Ambulatory Visit: Payer: Self-pay | Admitting: Cardiology

## 2017-07-26 MED FILL — CARVEDILOL 25 MG TABLET: 25 | 30 days supply | Qty: 60 | Fill #0

## 2017-07-26 NOTE — Telephone Encounter (Signed)
Patient called and stated that it was her understanding that she did not need an appointment with Dr Delton SeeNelson since she had an echo recently. Her last rx had a note that she needed an appointment. Per echo results, Dr Delton SeeNelson stated she could follow up PRN. Okay to extend refill?  Please advise. Thanks, MI

## 2017-07-26 NOTE — Telephone Encounter (Signed)
Yes, please refill. Thanks!

## 2017-08-11 ENCOUNTER — Ambulatory Visit: Payer: Self-pay | Admitting: Internal Medicine

## 2017-08-11 MED FILL — METFORMIN HCL ER 500 MG TAB: 500 | 45 days supply | Qty: 180 | Fill #2

## 2017-08-19 ENCOUNTER — Other Ambulatory Visit: Payer: Self-pay | Admitting: Family Medicine

## 2017-08-19 MED FILL — ADVAIR 100/50 DISKUS: 100-50 | 30 days supply | Qty: 60 | Fill #0

## 2017-08-26 MED FILL — CARVEDILOL 25 MG TABLET: 25 | 30 days supply | Qty: 60 | Fill #1

## 2017-09-02 MED FILL — busPIRone HCL 30 MG TABS: 30 | 90 days supply | Qty: 180 | Fill #0

## 2017-09-05 MED FILL — CYCLOBENZAPRINE 10 MG TAB: 10 | 17 days supply | Qty: 50 | Fill #1

## 2017-09-09 ENCOUNTER — Encounter: Payer: Self-pay | Admitting: Internal Medicine

## 2017-09-09 ENCOUNTER — Ambulatory Visit (INDEPENDENT_AMBULATORY_CARE_PROVIDER_SITE_OTHER): Payer: 59 | Admitting: Internal Medicine

## 2017-09-09 VITALS — BP 140/90 | HR 112 | Wt 367.0 lb

## 2017-09-09 DIAGNOSIS — E282 Polycystic ovarian syndrome: Secondary | ICD-10-CM

## 2017-09-09 DIAGNOSIS — R7303 Prediabetes: Secondary | ICD-10-CM

## 2017-09-09 DIAGNOSIS — Z23 Encounter for immunization: Secondary | ICD-10-CM | POA: Diagnosis not present

## 2017-09-09 LAB — LIPID PANEL
Cholesterol: 165 mg/dL (ref 0–200)
HDL: 53.3 mg/dL (ref >39.00)
NonHDL: 112.1
Total CHOL/HDL Ratio: 3
Triglycerides: 359 mg/dL — ABNORMAL HIGH (ref 0.0–149.0)
VLDL: 71.8 mg/dL — ABNORMAL HIGH (ref 0.0–40.0)

## 2017-09-09 LAB — TSH: TSH: 4.64 u[IU]/mL — ABNORMAL HIGH (ref 0.35–4.50)

## 2017-09-09 LAB — VITAMIN D 25 HYDROXY (VIT D DEFICIENCY, FRACTURES): VITD: 67.81 ng/mL (ref 30.00–100.00)

## 2017-09-09 LAB — POCT GLYCOSYLATED HEMOGLOBIN (HGB A1C): Hemoglobin A1C: 5.9

## 2017-09-09 LAB — LDL CHOLESTEROL, DIRECT: Direct LDL: 68 mg/dL

## 2017-09-09 NOTE — Progress Notes (Signed)
Patient ID: Amanda Massey, female   DOB: 02-14-1981, 36 y.o.   MRN: [id number]  HPI: Amanda Massey is a 36 y.o.-year-old female, initially referred by her nutritionist, Denny Levy for management of PCOS and prediabetes. Last visit 1 year ago. She is here with her husband who offers part of the history, especially related to her back pain and also to her previous medical history. ObGyn: Dr. Adalberto Ill.  Since last visit, she has much more severe back pain in the last 6 months. She saw neurosurgery >> referred to pain medicine - appt coming up. Now on Flexeril and hydrocodone.   She was dx with PCOS in 2009.  Reviewed and addended history: Fertility/Menstrual cycles: - menarche at 36 y/o - on OCP at 36 y/o >> then came off OCPs at 36 y/o - irregular menses after coming off OCP >> 3x a year - it took her 2 years to get pregnant, used Clomid - saw Dr Elesa Hacker >> tried many IUI tx's, other procedures except IVF >> did not work - they decided against another pregnancy (daughter born in 2002) - + h/o ovarian cysts - children: 1, but tried for another one >> She and her husband decided against another pregnancy - miscarriages: 1 - lost baby at 8 weeks in 2013 - contraception: OCPs now - LoLoestrin >> Orthocyclen  - had increased flow in some months on this at last visit  Weight gain: - stayed stable for most of her adult life - in ~2014 lost 60 lbs over 6 mo - diet (low carb) and exercise - gained 12 pounds since last visit - no steroid use, other than Prednisone for URI - no weight loss meds - she saw nutrition, Denny Levy - Diets tried: decreased portions, less starches - Exercise: not consistently  Acne: - no  Hirsutism: - upper lip/neck/chin - is using hair removal creams - also shaving - not bothersome  Treatments tried: - on Metformin 1000 mg 2x a day - did not try Spironolactone - did not try Vaniqua - on OCPs (Orthocyclene)  Last testosterone - high on  LoLoEstrin, then normalized on Ortho-Cyclen: Component     Latest Ref Rng & Units 09/23/2014 06/17/2015 08/10/2016  Testosterone     8 - 48 ng/dL 71 (H) 61 38  Sex Horm Binding Glob, Serum     24.6 - 122.0 nmol/L 55 36 120.2  Testosterone Free     0.0 - 4.2 pg/mL 9.2 (H) 10.5 (H) 2.2  Testosterone-% Free     0.4 - 2.4 % 1.3 1.7    - Last thyroid tests >> normal: Lab Results  Component Value Date   TSH 3.35 03/24/2016   FREET4 1.3 03/24/2016   - Last set of lipids >> HTG:    Component Value Date/Time   CHOL 167 08/10/2016 1008   TRIG 374.0 (H) 08/10/2016 1008   HDL 50.10 08/10/2016 1008   CHOLHDL 3 08/10/2016 1008   VLDL 74.8 (H) 08/10/2016 1008   LDLCALC 84 09/23/2014 1410   - last CMP:   Chemistry      Component Value Date/Time   NA 139 03/24/2016 1235   NA 140 08/10/2014 2238   K 4.3 03/24/2016 1235   K 3.4 (L) 08/10/2014 2238   CL 102 03/24/2016 1235   CL 104 08/10/2014 2238   CO2 24 03/24/2016 1235   CO2 29 08/10/2014 2238   BUN 8 03/24/2016 1235   BUN 6 (L) 08/10/2014 2238   CREATININE 0.69 03/24/2016  1235      Component Value Date/Time   CALCIUM 9.4 03/24/2016 1235   CALCIUM 9.1 08/10/2014 2238   ALKPHOS 66 03/24/2016 1235   AST 19 03/24/2016 1235   ALT 24 03/24/2016 1235   BILITOT 0.3 03/24/2016 1235     Vit D was normal at last visit: Lab Results  Component Value Date   VD25OH 63.48 08/10/2016   Prediabetes: - she has a h/o GDM in 2003   Last hemoglobin A1c was: Lab Results  Component Value Date   HGBA1C 6.0 08/10/2016   HGBA1C 5.6 06/17/2015   HGBA1C 5.8 09/23/2014   Pt is on a regimen of: - Metformin XR 1000 mg 2x a day She was on Byetta in the past.  No FH of DM.  She has problems with vertigo. As she also had palpitations >> Holter monitor, 2DEcho >> started Carvedilol >> sxs resolved.  She works from home.  ROS: Constitutional: + weight gain/no weight loss, no fatigue, no subjective hyperthermia, no subjective hypothermia Eyes:  no blurry vision, no xerophthalmia ENT: no sore throat, no nodules palpated in throat, no dysphagia, no odynophagia, no hoarseness Cardiovascular: no CP/no SOB/+ palpitations/no leg swelling Respiratory: no cough/no SOB/no wheezing Gastrointestinal: no N/no V/+ D/no C/no acid reflux Musculoskeletal: + muscle aches/+ joint aches Skin: no rashes, no hair loss Neurological: no tremors/no numbness/no tingling/no dizziness  I reviewed pt's medications, allergies, PMH, social hx, family hx, and changes were documented in the history of present illness. Otherwise, unchanged from my initial visit note. Celexa was increased to 60 mg daily.  Past Medical History:  Diagnosis Date  . Allergy   . Anxiety   . Asthma   . Back pain    tx with OTC meds  . Depression   . Elevated TSH 06/17/2015  . Hypertension    no rx meds  . Obesity   . PCOS (polycystic ovarian syndrome)   . Prediabetes   . Seasonal allergies   . Vertigo    Past Surgical History:  Procedure Laterality Date  . DILATION AND EVACUATION  03/17/2012   Procedure: DILATATION AND EVACUATION;  Surgeon: Jeani Hawking, MD;  Location: [facility];  Service: Gynecology;  Laterality: N/A;  . MULTIPLE TOOTH EXTRACTIONS     for braces  . svd     x 1  . WISDOM TOOTH EXTRACTION     History   Social History  . Marital Status: Married    Spouse Name: N/A    Number of Children: 1   Occupational History  . 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 Smoking status: Never Smoker   . Smokeless tobacco: Never Used  . Alcohol Use: No  . Drug Use: No   Social History Narrative   Current Outpatient Prescriptions on File Prior to Visit  Medication Sig Dispense Refill  . ADVAIR DISKUS 100-50 MCG/DOSE AEPB INHALE 1 PUFF BY MOUTH TWICE DAILY 60 each 5  . busPIRone (BUSPAR) 30 MG tablet Take 15 mg by mouth 2 (two) times daily.     . carvedilol (COREG) 25 MG tablet TAKE 1 TABLET (25 MG TOTAL) BY MOUTH  TWICE A DAY WITH A MEAL. 60 tablet 8  . cefdinir (OMNICEF) 300 MG capsule Take 1 capsule (300 mg total) by mouth 2 (two) times daily. 20 capsule 0  . cetirizine (ZYRTEC) 10 MG tablet Take 10 mg by mouth daily. Reported on 11/30/2015    . citalopram (  CELEXA) 20 MG tablet Take 20 mg by mouth 2 (two) times daily.    . clonazePAM (KLONOPIN) 1 MG tablet Take 1 mg by mouth 3 (three) times daily. Take as directed    . metFORMIN (GLUCOPHAGE-XR) 500 MG 24 hr tablet Take 1 tablet (500 mg total) by mouth 2 (two) times daily with a meal. 360 tablet 1  . MONO-LINYAH 0.25-35 MG-MCG tablet TAKE 1 TABLET BY MOUTH DAILY. 28 tablet 11  . predniSONE (DELTASONE) 20 MG tablet Take one tab by mouth twice daily for 5 days, then one daily. Take with food. 14 tablet 0  . VENTOLIN HFA 108 (90 Base) MCG/ACT inhaler INHALE 2 PUFFS INTO THE LUNGS EVERY 4 HOURS AS NEEDED FOR WHEEZING OR SHORTNESS OF BREATH. 18 g 4   No current facility-administered medications on file prior to visit.    No Known Allergies Family History  Problem Relation Age of Onset  . Parkinsonism Father   . Heart attack Paternal Grandfather   . Cancer Paternal Grandmother   . Alcohol abuse Neg Hx   . Depression Neg Hx   . Diabetes Neg Hx   . Hypertension Neg Hx   . Hyperlipidemia Neg Hx    PE: BP 140/90 (BP Location: Left Arm, Patient Position: Sitting)   Pulse (!) 112   Wt (!) 367 lb (166.5 kg)   SpO2 97%   BMI 59.24 kg/m  Wt Readings from Last 3 Encounters:  09/09/17 (!) 367 lb (166.5 kg)  01/10/17 (!) 355 lb (161 kg)  01/01/17 (!) 355 lb 4 oz (161.1 kg)   Constitutional: obese, in distress because of back pain - cries in the office today Eyes: PERRLA, EOMI, no exophthalmos ENT: moist mucous membranes, no thyromegaly, no cervical lymphadenopathy Cardiovascular: tachycardia, RR, No MRG Respiratory: CTA B Gastrointestinal: abdomen soft, NT, ND, BS+ Musculoskeletal: no deformities, strength intact in all 4 Skin: moist, warm, no rashes,  + mild acanthosis nigricans on neck, + slightly darker terminal hairs on chin Neurological: no tremor with outstretched hands, DTR normal in all 4   ASSESSMENT: 1. PCOS  2. Prediabetes  3. Obesity class III BMI Classification:  < 18.5 underweight   18.5-24.9 normal weight   25.0-29.9 overweight   30.0-34.9 class I obesity   35.0-39.9 class II obesity   ? 40.0 class III obesity   PLAN:  1. PCOS - she was previously doing well on low Loestrin, but her free testosterone remained high and we switched to Ortho-Cyclen 2 years ago. On this, her testosterone level normalized. However, she is interested in coming off oral contraceptives and wonders if this is safe to do. We discussed that OCPs roles in PCOS are:  Contraception - if we stop, they need to use another method of contraception  Normalizing menses (withdrawal bleeds) - we discussed that we can give her a year to see if she has at least 4 menses a year and possibly use Provera to trigger them if not, versus restarting OCPs  To help with hirsutism and acne - she is not bothered by these She understands and would like to come off. Will check a testosterone level today. -  we discussed in the past about the addition of spironolactone (however she is not very much bothered by her extra hair on chin, especially since this is blond).  -  of note, no further plans for pregnancy  - She would want me to recheck her annual labs: Orders Placed This Encounter  Procedures  . Flu  Vaccine QUAD 6+ mos PF IM (Fluarix Quad PF)  . COMPLETE METABOLIC PANEL WITH GFR  . Lipid panel  . TSH  . Testosterone, Free, Total, SHBG  . Vitamin D, 25-hydroxy  . Amb Ref to Medical Weight Management   2. Patient with borderline prediabetes - last HbA1c was slightly higher, at 6% - She continues on metformin 1000 mg twice a day  - HA1c today is slightly better, at 5.9%  3. Obesity class 3 - before last visit, she lost 10 pounds by cutting down  portions and eating only when hungry. However, since last visit, she gained back 12 pounds, as she is not very mobile.   Office Visit on 09/09/2017  Component Date Value Ref Range Status  . Glucose, Bld 09/09/2017 107* 65 - 99 mg/dL Final   Comment: .            Fasting reference interval . For someone without known diabetes, a glucose value between 100 and 125 mg/dL is consistent with prediabetes and should be confirmed with a follow-up test. .   . BUN 09/09/2017 7  7 - 25 mg/dL Final  . Creat 16/08/9603 0.72  0.50 - 1.10 mg/dL Final  . GFR, Est Non African American 09/09/2017 107  > OR = 60 mL/min/1.48m2 Final  . GFR, Est African American 09/09/2017 124  > OR = 60 mL/min/1.64m2 Final  . BUN/Creatinine Ratio 09/09/2017 NOT APPLICABLE  6 - 22 (calc) Final  . Sodium 09/09/2017 137  135 - 146 mmol/L Final  . Potassium 09/09/2017 4.7  3.5 - 5.3 mmol/L Final  . Chloride 09/09/2017 100  98 - 110 mmol/L Final  . CO2 09/09/2017 24  20 - 32 mmol/L Final  . Calcium 09/09/2017 9.4  8.6 - 10.2 mg/dL Final  . Total Protein 09/09/2017 7.0  6.1 - 8.1 g/dL Final  . Albumin 54/07/8118 3.7  3.6 - 5.1 g/dL Final  . Globulin 14/78/2956 3.3  1.9 - 3.7 g/dL (calc) Final  . AG Ratio 09/09/2017 1.1  1.0 - 2.5 (calc) Final  . Total Bilirubin 09/09/2017 0.3  0.2 - 1.2 mg/dL Final  . Alkaline phosphatase (APISO) 09/09/2017 65  33 - 115 U/L Final  . AST 09/09/2017 17  10 - 30 U/L Final  . ALT 09/09/2017 21  6 - 29 U/L Final  . Cholesterol 09/09/2017 165  0 - 200 mg/dL Final   ATP III Classification       Desirable:  < 200 mg/dL               Borderline High:  200 - 239 mg/dL          High:  > = 213 mg/dL  . Triglycerides 09/09/2017 359.0* 0.0 - 149.0 mg/dL Final   Normal:  <086 mg/dLBorderline High:  150 - 199 mg/dL  . HDL 09/09/2017 53.30  >39.00 mg/dL Final  . VLDL 57/84/6962 71.8* 0.0 - 40.0 mg/dL Final  . Total CHOL/HDL Ratio 09/09/2017 3   Final                  Men          Women1/2 Average Risk      3.4          3.3Average Risk          5.0          4.42X Average Risk          9.6  7.13X Average Risk          15.0          11.0                      . NonHDL 09/09/2017 112.10   Final   NOTE:  Non-HDL goal should be 30 mg/dL higher than patient's LDL goal (i.e. LDL goal of < 70 mg/dL, would have non-HDL goal of < 100 mg/dL)  . TSH 09/09/2017 4.64* 0.35 - 4.50 uIU/mL Final  . Testosterone 09/09/2017 25  8 - 48 ng/dL Final  . Testosterone, Free 09/09/2017 1.4  0.0 - 4.2 pg/mL Final  . Sex Hormone Binding 09/09/2017 128.8* 24.6 - 122.0 nmol/L Final  . VITD 09/09/2017 67.81  30.00 - 100.00 ng/mL Final  . Hemoglobin A1C 09/09/2017 5.9   Final  . Direct LDL 09/09/2017 68.0  mg/dL Final   Optimal:  <161<100 mg/dLNear or Above Optimal:  100-129 mg/dLBorderline High:  130-159 mg/dLHigh:  160-189 mg/dLVery High:  >190 mg/dL   TSH is slightly  high, expected with obesity. Will recheck at next visit. Testosterone normal, and SHBG high, as expected from OCPs.  Carlus Pavlovristina Charletta Voight, MD PhD [facility]

## 2017-09-09 NOTE — Patient Instructions (Signed)
You can stop Orthocyclene.  Please stop at the lab.  Please come back for a follow-up appointment in 1 year.

## 2017-09-10 LAB — COMPLETE METABOLIC PANEL WITH GFR
AG Ratio: 1.1 (calc) (ref 1.0–2.5)
ALBUMIN MSPROF: 3.7 g/dL (ref 3.6–5.1)
ALT: 21 U/L (ref 6–29)
AST: 17 U/L (ref 10–30)
Alkaline phosphatase (APISO): 65 U/L (ref 33–115)
BILIRUBIN TOTAL: 0.3 mg/dL (ref 0.2–1.2)
BUN: 7 mg/dL (ref 7–25)
CALCIUM: 9.4 mg/dL (ref 8.6–10.2)
CHLORIDE: 100 mmol/L (ref 98–110)
CO2: 24 mmol/L (ref 20–32)
Creat: 0.72 mg/dL (ref 0.50–1.10)
GFR, EST AFRICAN AMERICAN: 124 mL/min/{1.73_m2} (ref >=60)
GFR, EST NON AFRICAN AMERICAN: 107 mL/min/{1.73_m2} (ref >=60)
GLUCOSE: 107 mg/dL — AB (ref 65–99)
Globulin: 3.3 g/dL (calc) (ref 1.9–3.7)
Potassium: 4.7 mmol/L (ref 3.5–5.3)
Sodium: 137 mmol/L (ref 135–146)
TOTAL PROTEIN: 7 g/dL (ref 6.1–8.1)

## 2017-09-10 LAB — TESTOSTERONE, FREE, TOTAL, SHBG
SEX HORMONE BINDING: 128.8 nmol/L — AB (ref 24.6–122.0)
Testosterone, Free: 1.4 pg/mL (ref 0.0–4.2)
Testosterone: 25 ng/dL (ref 8–48)

## 2017-09-12 MED FILL — HYDROCODON-APAP 5-325: 5-325 | 8 days supply | Qty: 50 | Fill #0

## 2017-09-19 ENCOUNTER — Encounter: Payer: Self-pay | Admitting: Internal Medicine

## 2017-09-19 MED FILL — clonazePAM 1 MG TABS: 1 | 88 days supply | Qty: 221 | Fill #0

## 2017-09-22 MED FILL — CARVEDILOL 25 MG TABLET: 25 | 90 days supply | Qty: 180 | Fill #2

## 2017-09-28 DIAGNOSIS — M545 Low back pain: Secondary | ICD-10-CM | POA: Diagnosis not present

## 2017-09-28 DIAGNOSIS — G8929 Other chronic pain: Secondary | ICD-10-CM | POA: Diagnosis not present

## 2017-09-28 MED FILL — DULoxetine HCL 30 MG CPEP: 30 | 30 days supply | Qty: 60 | Fill #0

## 2017-09-30 MED FILL — METFORMIN HCL ER 500 MG TAB: 500 | 45 days supply | Qty: 180 | Fill #0

## 2017-10-07 MED FILL — CITALOPRAM HBR 40 MG TABLET: 40 | 90 days supply | Qty: 135 | Fill #1

## 2017-10-11 MED FILL — HYDROCODON-APAP 5-325: 5-325 | 8 days supply | Qty: 50 | Fill #0

## 2017-10-20 MED FILL — ADVAIR 100/50 DISKUS: 100-50 | 30 days supply | Qty: 60 | Fill #1

## 2017-10-28 ENCOUNTER — Emergency Department
Admission: EM | Admit: 2017-10-28 | Discharge: 2017-10-28 | Disposition: A | Discharge status: Home / Self Care | Payer: 59 | Source: Home / Self Care | Attending: Family Medicine | Admitting: Family Medicine

## 2017-10-28 ENCOUNTER — Other Ambulatory Visit: Payer: Self-pay | Admitting: Family Medicine

## 2017-10-28 ENCOUNTER — Other Ambulatory Visit: Payer: Self-pay

## 2017-10-28 DIAGNOSIS — J0100 Acute maxillary sinusitis, unspecified: Secondary | ICD-10-CM

## 2017-10-28 MED ORDER — PREDNISONE 20 MG PO TABS: ORAL_TABLET | ORAL | 0 refills | Status: DC

## 2017-10-28 MED ORDER — AMOXICILLIN-POT CLAVULANATE 875-125 MG PO TABS: 1.0000 | ORAL_TABLET | Freq: Two times a day (BID) | ORAL | 0 refills | Status: DC

## 2017-10-28 MED FILL — DULoxetine HCL 30 MG CPEP: 30 | 30 days supply | Qty: 60 | Fill #1

## 2017-10-28 MED FILL — VENTOLIN HFA 90 MCG INHALER: 108 (90 BAS | 25 days supply | Qty: 18 | Fill #0

## 2017-10-28 NOTE — ED Provider Notes (Signed)
[facility]    CSN: [id number] Arrival date & time: 10/28/17  1858     History   Chief Complaint Chief Complaint  Patient presents with  . Facial Pain  . Nasal Congestion    HPI Amanda PiccoloDeborah L Culbreth is a 36 y.o. female.   HPI Amanda Massey is a 36 y.o. female presenting to UC with c/o 2 weeks of sinus congestion and pressure that initially started as "cold-like symptoms" started to improve but then began to worsen.  Symptoms much worse yesterday with bilateral ear fullness and pressure and sinus pain.  She has been coughing up and blowing out a moderate amount of greenish mucous.  Denies fever, chills, n/v/d. She has been taking Mucinex 1200mg  ER and used Afrin with minimal relief.  BP elevated in triage. Hx of HTN but states current BP is high for her. BP recheck was improved. Denies chest pain, change in vision or new symptoms unrelated to sinus congestion.  Past Medical History:  Diagnosis Date  . Allergy   . Anxiety   . Asthma   . Back pain    tx with OTC meds  . Depression   . Elevated TSH 06/17/2015  . Hypertension    no rx meds  . Obesity   . PCOS (polycystic ovarian syndrome)   . Prediabetes   . Seasonal allergies   . Vertigo     Patient Active Problem List   Diagnosis Date Noted  . Palpitations 03/24/2016  . PVC (premature ventricular contraction) 03/24/2016  . SOB (shortness of breath) 03/24/2016  . Hypertensive heart disease without heart failure 03/24/2016  . Breast skin changes 02/20/2016  . Essential hypertension 09/11/2014  . LVH (left ventricular hypertrophy) 08/16/2014  . Morbid obesity (HCC) 08/16/2014  . Tachycardia 07/05/2014  . Prediabetes 12/07/2012  . Anxiety and depression 07/07/2011  . PCOS (polycystic ovarian syndrome) 07/07/2011  . Vertigo 07/07/2011  . Asthma, intermittent 07/07/2011    Past Surgical History:  Procedure Laterality Date  . DILATION AND EVACUATION  03/17/2012   Procedure: DILATATION AND EVACUATION;   Surgeon: Jeani HawkingMichelle L Grewal, MD;  Location: [facility];  Service: Gynecology;  Laterality: N/A;  . MULTIPLE TOOTH EXTRACTIONS     for braces  . svd     x 1  . WISDOM TOOTH EXTRACTION      OB History    Gravida Para Term Preterm AB Living   2 1 1   1 1    SAB TAB Ectopic Multiple Live Births   1               Home Medications    Prior to Admission medications   Medication Sig Start Date End Date Taking? Authorizing Provider  ADVAIR DISKUS 100-50 MCG/DOSE AEPB INHALE 1 PUFF BY MOUTH TWICE DAILY 08/19/17   Doreene ElandEniola, Kehinde T, MD  amoxicillin-clavulanate (AUGMENTIN) 875-125 MG tablet Take 1 tablet by mouth 2 (two) times daily. One po bid x 7 days 10/28/17   Lurene ShadowPhelps, Algis Lehenbauer O, PA-C  busPIRone (BUSPAR) 30 MG tablet Take 15 mg by mouth 2 (two) times daily.     [provider]  carvedilol (COREG) 25 MG tablet TAKE 1 TABLET (25 MG TOTAL) BY MOUTH TWICE A DAY WITH A MEAL. 07/26/17   Lars MassonNelson, Katarina H, MD  cefdinir (OMNICEF) 300 MG capsule Take 1 capsule (300 mg total) by mouth 2 (two) times daily. 01/01/17   Lattie HawBeese, Stephen A, MD  cetirizine (ZYRTEC) 10 MG tablet Take 10 mg by mouth  daily. Reported on 11/30/2015    [provider]  citalopram (CELEXA) 40 MG tablet TAKE 1   1 2  TABLETS BY MOUTH DAILY 07/01/17   [provider]  clonazePAM (KLONOPIN) 1 MG tablet Take 1 mg by mouth 3 (three) times daily. Take as directed    [provider]  cyclobenzaprine (FLEXERIL) 10 MG tablet Take 10 mg by mouth 3 (three) times daily as needed for muscle spasms.    [provider]  HYDROcodone-acetaminophen (NORCO/VICODIN) 5-325 MG tablet Take 1 tablet by mouth every 8 (eight) hours as needed for moderate pain.    [provider]  metFORMIN (GLUCOPHAGE-XR) 500 MG 24 hr tablet Take 1 tablet (500 mg total) by mouth 2 (two) times daily with a meal. 05/16/17   Carlus Pavlov, MD  [facility] 0.25-35 MG-MCG tablet TAKE 1 TABLET BY MOUTH DAILY. 04/08/17   Carlus Pavlov, MD    predniSONE (DELTASONE) 20 MG tablet 3 tabs po day one, then 2 po daily x 4 days 10/28/17   Lurene Shadow, PA-C  VENTOLIN HFA 108 (90 Base) MCG/ACT inhaler INHALE 2 PUFFS INTO THE LUNGS EVERY 4 HOURS AS NEEDED FOR WHEEZING OR SHORTNESS OF BREATH. 10/28/17   Doreene Eland, MD    Family History Family History  Problem Relation Age of Onset  . Parkinsonism Father   . Heart attack Paternal Grandfather   . Cancer Paternal Grandmother   . Alcohol abuse Neg Hx   . Depression Neg Hx   . Diabetes Neg Hx   . Hypertension Neg Hx   . Hyperlipidemia Neg Hx     Social History Social History   Tobacco Use  . Smoking status: Never Smoker  . Smokeless tobacco: Never Used  Substance Use Topics  . Alcohol use: No  . Drug use: No     Allergies   Patient has no known allergies.   Review of Systems Review of Systems  Constitutional: Negative for chills and fever.  HENT: Positive for ear pain, postnasal drip, sinus pressure and sinus pain. Negative for congestion, sore throat, trouble swallowing and voice change.   Respiratory: Positive for cough. Negative for shortness of breath.   Cardiovascular: Negative for chest pain and palpitations.  Gastrointestinal: Negative for abdominal pain, diarrhea, nausea and vomiting.  Musculoskeletal: Negative for arthralgias, back pain and myalgias.  Skin: Negative for rash.  Neurological: Positive for headaches. Negative for dizziness and light-headedness.     Physical Exam Triage Vital Signs ED Triage Vitals  Enc Vitals Group     BP 10/28/17 1918 (!) 184/115     Pulse Rate 10/28/17 1918 (!) 116     Resp --      Temp 10/28/17 1918 97.8 F (36.6 C)     Temp Source 10/28/17 1918 Oral     SpO2 10/28/17 1918 96 %     Weight 10/28/17 1919 (!) 370 lb (167.8 kg)     Height 10/28/17 1919 5\' 6"  (1.676 m)     Head Circumference --      Peak Flow --      Pain Score 10/28/17 1919 2     Pain Loc --      Pain Edu? --      Excl. in GC? --    No  data found.  Updated Vital Signs BP (!) 166/94 (BP Location: Right Arm)   Pulse (!) 116   Temp 97.8 F (36.6 C) (Oral)   Ht 5\' 6"  (1.676 m)   Wt Marland Kitchen)  370 lb (167.8 kg)   LMP 09/15/2017   SpO2 96%   BMI 59.72 kg/m   Visual Acuity Right Eye Distance:   Left Eye Distance:   Bilateral Distance:    Right Eye Near:   Left Eye Near:    Bilateral Near:     Physical Exam  Constitutional: She is oriented to person, place, and time. She appears well-developed and well-nourished. No distress.  HENT:  Head: Normocephalic and atraumatic.  Right Ear: Tympanic membrane normal.  Left Ear: Tympanic membrane normal.  Nose: Mucosal edema present. Right sinus exhibits maxillary sinus tenderness. Right sinus exhibits no frontal sinus tenderness. Left sinus exhibits maxillary sinus tenderness. Left sinus exhibits no frontal sinus tenderness.  Mouth/Throat: Uvula is midline, oropharynx is clear and moist and mucous membranes are normal.  Eyes: EOM are normal.  Neck: Normal range of motion. Neck supple.  Cardiovascular: Normal rate and regular rhythm.  Pulmonary/Chest: Effort normal and breath sounds normal. No stridor. No respiratory distress. She has no wheezes. She has no rales.  Musculoskeletal: Normal range of motion.  Lymphadenopathy:    She has no cervical adenopathy.  Neurological: She is alert and oriented to person, place, and time.  Skin: Skin is warm and dry. She is not diaphoretic.  Psychiatric: She has a normal mood and affect. Her behavior is normal.  Nursing note and vitals reviewed.    UC Treatments / Results  Labs (all labs ordered are listed, but only abnormal results are displayed) Labs Reviewed - No data to display  EKG  EKG Interpretation None       Radiology No results found.  Procedures Procedures (including critical care time)  Medications Ordered in UC Medications - No data to display   Initial Impression / Assessment and Plan / UC Course  I have  reviewed the triage vital signs and the nursing notes.  Pertinent labs & imaging results that were available during my care of the patient were reviewed by me and considered in my medical decision making (see chart for details).     Hx and exam c/w maxillary sinusitis Home care instructions provided Encouraged to monitor her BP as prednisone can also make it be elevated  F/u with PCP next week if not improving.   Final Clinical Impressions(s) / UC Diagnoses   Final diagnoses:  Acute non-recurrent maxillary sinusitis    ED Discharge Orders        Ordered    amoxicillin-clavulanate (AUGMENTIN) 875-125 MG tablet  2 times daily     10/28/17 1931    predniSONE (DELTASONE) 20 MG tablet     10/28/17 1931       Controlled Substance Prescriptions [place] Controlled Substance Registry consulted? Not Applicable   Rolla Platehelps, Rheya Minogue O, PA-C 10/28/17 1949

## 2017-10-28 NOTE — ED Triage Notes (Signed)
Has been going on for about 2 weeks, yesterday felt much worse.  Ear pain bilaterally, pressure in ears and head.  Facial pain all over, coughing up and blowing out greenish mucous.

## 2017-10-30 ENCOUNTER — Telehealth: Payer: Self-pay | Admitting: Emergency Medicine

## 2017-10-30 NOTE — Telephone Encounter (Signed)
Inquired about patient's status; encourage them to call with questions/concerns.  

## 2017-11-11 MED FILL — METFORMIN HCL ER 500 MG TAB: 500 | 45 days supply | Qty: 180 | Fill #1

## 2017-11-28 MED FILL — HYDROCODON-APAP 5-325: 5-325 | 8 days supply | Qty: 50 | Fill #0

## 2017-12-02 MED FILL — busPIRone HCL 30 MG TABS: 30 | 90 days supply | Qty: 180 | Fill #1

## 2017-12-05 ENCOUNTER — Other Ambulatory Visit: Payer: Self-pay | Admitting: Family Medicine

## 2017-12-05 MED FILL — PROMETHAZINE 25 MG TABLET: 25 | 10 days supply | Qty: 42 | Fill #0

## 2017-12-07 ENCOUNTER — Encounter: Payer: Self-pay | Admitting: Internal Medicine

## 2017-12-16 MED FILL — clonazePAM 1 MG TABS: 1 | 90 days supply | Qty: 225 | Fill #0

## 2017-12-21 DIAGNOSIS — M545 Low back pain: Secondary | ICD-10-CM | POA: Diagnosis not present

## 2017-12-21 MED FILL — TOPIRAMATE 25 MG TAB: 25 | 30 days supply | Qty: 60 | Fill #0

## 2017-12-21 MED FILL — HYDROCODON-APAP 5-325: 5-325 | 8 days supply | Qty: 50 | Fill #0

## 2017-12-22 ENCOUNTER — Encounter (INDEPENDENT_AMBULATORY_CARE_PROVIDER_SITE_OTHER): Payer: 59

## 2017-12-22 MED FILL — ADVAIR 100/50 DISKUS: 100-50 | 30 days supply | Qty: 60 | Fill #2

## 2017-12-23 MED FILL — CARVEDILOL 25 MG TABLET: 25 | 90 days supply | Qty: 180 | Fill #3

## 2017-12-29 ENCOUNTER — Encounter (INDEPENDENT_AMBULATORY_CARE_PROVIDER_SITE_OTHER): Payer: 59

## 2017-12-30 MED FILL — METFORMIN HCL ER 500 MG TAB: 500 | 45 days supply | Qty: 180 | Fill #2

## 2018-01-02 MED FILL — DULoxetine HCL 60 MG CPEP: 60 | 90 days supply | Qty: 90 | Fill #0

## 2018-01-05 ENCOUNTER — Ambulatory Visit (INDEPENDENT_AMBULATORY_CARE_PROVIDER_SITE_OTHER): Payer: 59 | Admitting: Family Medicine

## 2018-01-11 ENCOUNTER — Encounter (INDEPENDENT_AMBULATORY_CARE_PROVIDER_SITE_OTHER): Payer: 59

## 2018-01-16 MED FILL — TOPIRAMATE 25 MG TAB: 25 | 30 days supply | Qty: 60 | Fill #1

## 2018-01-18 MED FILL — TOPIRAMATE 50 MG TABLET: 50 | 30 days supply | Qty: 60 | Fill #0

## 2018-01-18 MED FILL — HYDROCODON-APAP 5-325: 5-325 | 8 days supply | Qty: 50 | Fill #0

## 2018-01-19 ENCOUNTER — Ambulatory Visit (INDEPENDENT_AMBULATORY_CARE_PROVIDER_SITE_OTHER): Payer: 59 | Admitting: Family Medicine

## 2018-02-07 MED FILL — CITALOPRAM HBR 20 MG TABLET: 20 | 90 days supply | Qty: 90 | Fill #0

## 2018-02-13 MED FILL — HYDROCODON-APAP 5-325: 5-325 | 8 days supply | Qty: 50 | Fill #0

## 2018-02-17 MED FILL — METFORMIN HCL ER 500 MG TAB: 500 | 45 days supply | Qty: 180 | Fill #3

## 2018-02-17 MED FILL — TOPIRAMATE 50 MG TABLET: 50 | 30 days supply | Qty: 60 | Fill #1

## 2018-02-20 MED FILL — ADVAIR 100/50 DISKUS: 100-50 | 30 days supply | Qty: 60 | Fill #3

## 2018-03-03 MED FILL — busPIRone HCL 30 MG TABS: 30 | 30 days supply | Qty: 60 | Fill #0

## 2018-03-06 DIAGNOSIS — M545 Low back pain: Secondary | ICD-10-CM | POA: Diagnosis not present

## 2018-03-06 MED FILL — HYDROCODON-APAP 5-325: 5-325 | 8 days supply | Qty: 50 | Fill #0

## 2018-03-10 MED FILL — DULoxetine HCL 30 MG CPEP: 30 | 30 days supply | Qty: 30 | Fill #0

## 2018-03-17 MED FILL — clonazePAM 1 MG TABS: 1 | 90 days supply | Qty: 225 | Fill #0

## 2018-03-23 ENCOUNTER — Other Ambulatory Visit: Payer: Self-pay | Admitting: Internal Medicine

## 2018-03-23 MED FILL — CARVEDILOL 25 MG TABLET: 25 | 30 days supply | Qty: 60 | Fill #4

## 2018-03-23 MED FILL — TOPIRAMATE 25 MG TAB: 25 | 30 days supply | Qty: 120 | Fill #0

## 2018-03-24 MED FILL — HYDROCODON-APAP 5-325: 5-325 | 8 days supply | Qty: 50 | Fill #0

## 2018-03-25 MED FILL — METFORMIN HCL ER 500 MG TAB: 500 | 45 days supply | Qty: 180 | Fill #0

## 2018-03-31 MED FILL — busPIRone HCL 30 MG TABS: 30 | 30 days supply | Qty: 60 | Fill #1

## 2018-03-31 MED FILL — DULoxetine HCL 60 MG CPEP: 60 | 90 days supply | Qty: 90 | Fill #1

## 2018-04-07 MED FILL — DULoxetine HCL 30 MG CPEP: 30 | 30 days supply | Qty: 30 | Fill #1

## 2018-04-11 MED FILL — HYDROCODON-APAP 5-325: 5-325 | 8 days supply | Qty: 50 | Fill #0

## 2018-04-14 ENCOUNTER — Encounter: Payer: Self-pay | Admitting: Family Medicine

## 2018-04-21 MED FILL — ADVAIR 100/50 DISKUS: 100-50 | 30 days supply | Qty: 60 | Fill #4

## 2018-04-21 MED FILL — TOPIRAMATE 25 MG TAB: 25 | 30 days supply | Qty: 120 | Fill #1

## 2018-04-25 ENCOUNTER — Encounter: Payer: Self-pay | Admitting: Family Medicine

## 2018-04-25 MED FILL — HYDROCODON-APAP 5-325: 5-325 | 8 days supply | Qty: 50 | Fill #0

## 2018-04-27 ENCOUNTER — Other Ambulatory Visit: Payer: Self-pay | Admitting: Cardiology

## 2018-04-28 MED FILL — CARVEDILOL 25 MG TABLET: 25 | 15 days supply | Qty: 30 | Fill #0

## 2018-05-02 ENCOUNTER — Encounter: Payer: Self-pay | Admitting: Family Medicine

## 2018-05-02 ENCOUNTER — Ambulatory Visit (INDEPENDENT_AMBULATORY_CARE_PROVIDER_SITE_OTHER): Payer: 59 | Admitting: Family Medicine

## 2018-05-02 ENCOUNTER — Other Ambulatory Visit: Payer: Self-pay

## 2018-05-02 VITALS — BP 135/90 | HR 110 | Temp 97.9°F | Ht 66.0 in | Wt 336.0 lb

## 2018-05-02 DIAGNOSIS — M545 Low back pain, unspecified: Secondary | ICD-10-CM

## 2018-05-02 DIAGNOSIS — Z0000 Encounter for general adult medical examination without abnormal findings: Secondary | ICD-10-CM | POA: Diagnosis not present

## 2018-05-02 DIAGNOSIS — Z5320 Procedure and treatment not carried out because of patient's decision for unspecified reasons: Secondary | ICD-10-CM | POA: Diagnosis not present

## 2018-05-02 DIAGNOSIS — G8929 Other chronic pain: Secondary | ICD-10-CM

## 2018-05-02 DIAGNOSIS — E781 Pure hyperglyceridemia: Secondary | ICD-10-CM

## 2018-05-02 MED ORDER — FLUTICASONE-SALMETEROL 100-50 MCG/DOSE IN AEPB: 1.0000 | INHALATION_SPRAY | Freq: Two times a day (BID) | RESPIRATORY_TRACT | 5 refills | Status: AC

## 2018-05-02 NOTE — Patient Instructions (Signed)
Fish Oil, Omega-3 Fatty Acids capsules (OTC)  What is this medicine?  FISH OIL, OMEGA-3 FATTY ACIDS (Fish Oil, oh MAY ga - 3 fatty AS ids) are essential fats. It is promoted to help support a healthy heart. This dietary supplement is used to add to a healthy diet. The FDA has not approved this supplement for any medical use.  This supplement may be used for other purposes; ask your health care provider or pharmacist if you have questions.  This medicine may be used for other purposes; ask your health care provider or pharmacist if you have questions.  COMMON BRAND NAME(S): Magna Omega-3, Ocean Blue Nutritionals Omega-3 1450, Ocean Blue Omega, Ocean Blue Professional Omega-3 2100, Omega-3, Omega-3 Krill Oil, Ovega-3, Sea-Omega, TherOmega  What should I tell my health care provider before I take this medicine?  They need to know if you have any of these conditions  -bleeding problems  -lung or breathing disease, like asthma  -an unusual or allergic reaction to fish oil, omega-3 fatty acids, fish, other medicines, foods, dyes, or preservatives  -pregnant or trying to get pregnant  -breast-feeding  How should I use this medicine?  Take this medicine by mouth with a glass of water. Follow the directions on the package or prescription label. Take with food. Take your medicine at regular intervals. Do not take your medicine more often than directed.  Talk to your pediatrician regarding the use of this medicine in children. Special care may be needed. This medicine should not be used in children without a doctor's advice.  Overdosage: If you think you have taken too much of this medicine contact a poison control center or emergency room at once.  NOTE: This medicine is only for you. Do not share this medicine with others.  What if I miss a dose?  If you miss a dose, take it as soon as you can. If it is almost time for your next dose, take only that dose. Do not take double or extra doses.  What may interact with this  medicine?  -aspirin and aspirin-like medicines  -herbal products like danshen, dong quai, garlic pills, ginger, ginkgo biloba, horse chestnut, willow bark, and others  -medicines that treat or prevent blood clots like enoxaparin, heparin, warfarin  This list may not describe all possible interactions. Give your health care provider a list of all the medicines, herbs, non-prescription drugs, or dietary supplements you use. Also tell them if you smoke, drink alcohol, or use illegal drugs. Some items may interact with your medicine.  What should I watch for while using this medicine?  Follow a good diet and exercise plan. Taking a dietary supplement does not replace a healthy lifestyle. Some foods that have omega-3 fatty acids naturally are fatty fish like albacore tuna, halibut, herring, mackerel, lake trout, salmon, and sardines.  Too much of this supplement can be unsafe. Talk to your doctor or health care provider about how much of this supplement is right for you.  If you are scheduled for any medical or dental procedure, tell your healthcare provider that you are taking this medicine. You may need to stop taking this medicine before the procedure.  Herbal or dietary supplements are not regulated like medicines. Rigid quality control standards are not required for dietary supplements. The purity and strength of these products can vary. The safety and effect of this dietary supplement for a certain disease or illness is not well known. This product is not intended to diagnose, treat, cure   or prevent any disease.  The Food and Drug Administration suggests the following to help consumers protect themselves:  -Always read product labels and follow directions.  -Natural does not mean a product is safe for humans to take.  -Look for products that include USP after the ingredient name. This means that the manufacturer followed the standards of the [place] Pharmacopoeia.  -Supplements made or sold by a nationally known food or  drug company are more likely to be made under tight controls. You can write to the company for more information about how the product was made.  What side effects may I notice from receiving this medicine?  Side effects that you should report to your doctor or health care professional as soon as possible:  -allergic reactions like skin rash, itching or hives, swelling of the face, lips, or tongue  -breathing problems  -changes in your moods or emotions  -unusual bleeding or bruising  Side effects that usually do not require medical attention (report to your doctor or health care professional if they continue or are bothersome):  -bad or fishy breath  -belching  -diarrhea  -nausea  -stomach gas, upset  -weight gain  This list may not describe all possible side effects. Call your doctor for medical advice about side effects. You may report side effects to FDA at 1-800-FDA-1088.  Where should I keep my medicine?  Keep out of the reach of children.  Store at room temperature or as directed on the package label. Protect from moisture. Do not freeze. Throw away any unused medicine after the expiration date.  NOTE: This sheet is a summary. It may not cover all possible information. If you have questions about this medicine, talk to your doctor, pharmacist, or health care provider.   2018 Elsevier/Gold Standard (2015-02-20 09:36:32)

## 2018-05-02 NOTE — Assessment & Plan Note (Signed)
Lost weight on diet and exercise.

## 2018-05-02 NOTE — Progress Notes (Signed)
Subjective:     Amanda Massey is a 37 y.o. female and is here for a comprehensive physical exam. The patient reports problems - back pain, was seen by neuro specialist 1 yr ago and was told she sprained her back. She was given pain meds and exercise. She was referred to their pain specialist in conjuction with her psychiatrist.. She had an xray of her spine and was told it was normal then. Pain is worsening and it is about 10/10 in severity. She is planning to call them for f/u soon. PAP is usually done with her OB/GYN and she is scheduled for it July 2nd with [facility] (Dr. Levada SchillingGrewald).  F/U Triglyceride. Weight management: She has been working mostly on diet and some exercise.  Social History   Socioeconomic History  . Marital status: Married    Spouse name: Not on file  . Number of children: Not on file  . Years of education: Not on file  . Highest education level: Not on file  Occupational History  . Not on file  Social Needs  . Financial resource strain: Not on file  . Food insecurity:    Worry: Not on file    Inability: Not on file  . Transportation needs:    Medical: Not on file    Non-medical: Not on file  Tobacco Use  . Smoking status: Never Smoker  . Smokeless tobacco: Never Used  Substance and Sexual Activity  . Alcohol use: No  . Drug use: No  . Sexual activity: Yes    Birth control/protection: Pill    Comment: Pregnant approx 8 wks  Lifestyle  . Physical activity:    Days per week: Not on file    Minutes per session: Not on file  . Stress: Not on file  Relationships  . Social connections:    Talks on phone: Not on file    Gets together: Not on file    Attends religious service: Not on file    Active member of club or organization: Not on file    Attends meetings of clubs or organizations: Not on file    Relationship status: Not on file  . Intimate partner violence:    Fear of current or ex partner: Not on file    Emotionally abused: Not on  file    Physically abused: Not on file    Forced sexual activity: Not on file  Other Topics Concern  . Not on file  Social History Narrative  . Not on file   Health Maintenance  Topic Date Due  . PAP SMEAR  06/25/2017  . INFLUENZA VACCINE  06/15/2018  . TETANUS/TDAP  07/06/2021  . HIV Screening  Completed    The following portions of the patient's history were reviewed and updated as appropriate: allergies, current medications, past family history, past medical history, past social history, past surgical history and problem list.  Review of Systems Pertinent items noted in HPI and remainder of comprehensive ROS otherwise negative.   Objective:    Pulse (!) 110   Temp 97.9 F (36.6 C) (Oral)   Ht 5\' 6"  (1.676 m)   Wt (!) 336 lb (152.4 kg)   SpO2 97%   BMI 54.23 kg/m  General appearance: alert, cooperative and appears stated age Head: Normocephalic, without obvious abnormality, atraumatic Eyes: conjunctivae/corneas clear. PERRL, EOM's intact. Fundi benign. Ears: normal TM's and external ear canals both ears Throat: lips, mucosa, and tongue normal; teeth and gums normal Neck: no  adenopathy, no carotid bruit, no JVD, supple, symmetrical, trachea midline and thyroid not enlarged, symmetric, no tenderness/mass/nodules Lungs: clear to auscultation bilaterally Heart: regular rate and rhythm, S1, S2 normal, no murmur, click, rub or gallop Abdomen: soft, non-tender; bowel sounds normal; no masses,  no organomegaly Extremities: extremities normal, atraumatic, no cyanosis or edema Skin: Skin color, texture, turgor normal. No rashes or lesions Neurologic: Alert and oriented X 3, normal strength and tone. Normal symmetric reflexes. Normal coordination and gait    Assessment:    Healthy female exam with the following findings: 1. Morbid obesity Hypertriglyceridemia Back pain: Chronic Plan:   She lost about 40 lbs since last visit. I commended her on a job well done. She will  continue to work on weight loss.  Recheck FLP today.  She declined PAP with [place]. She always have it with her OB/GYN and will like to continue to get her PAP done with them. She will let us know if the plan changes,. She has an upcoming appointment already scheduled for PAP.  She will contact Neurosurg for back pain and potential MRI of her back. F/U with me as needed. Continue pain meds per pain clinic.

## 2018-05-02 NOTE — Assessment & Plan Note (Signed)
She declined PAP with us. She always have it with her OB/GYN and will like to continue to get her PAP done with them. She will let us know if the plan changes,. She has an upcoming appointment already scheduled for PAP.  PAP HM schedule updated per her request.

## 2018-05-03 ENCOUNTER — Telehealth: Payer: Self-pay | Admitting: Family Medicine

## 2018-05-03 LAB — LIPID PANEL
Chol/HDL Ratio: 4.1 ratio (ref 0.0–4.4)
Cholesterol, Total: 175 mg/dL (ref 100–199)
HDL: 43 mg/dL (ref >39)
LDL Calculated: 81 mg/dL (ref 0–99)
Triglycerides: 253 mg/dL — ABNORMAL HIGH (ref 0–149)
VLDL CHOLESTEROL CAL: 51 mg/dL — AB (ref 5–40)

## 2018-05-03 NOTE — Telephone Encounter (Signed)
Patient called back. Gave results and instructions. Verbalized understanding. Ples SpecterAlisa Tamer Baughman, RN [facility] RN)

## 2018-05-03 NOTE — Telephone Encounter (Signed)
HIPPA compliant call back message left.  When she calls, please let her know that her Triglyceride improved but still elevated.  It dropped from 359 to 253 Continue diet plan and start fish oil as instructed during her visit.

## 2018-05-05 MED FILL — busPIRone HCL 30 MG TABS: 30 | 30 days supply | Qty: 60 | Fill #2

## 2018-05-09 ENCOUNTER — Encounter: Payer: Self-pay | Admitting: Physician Assistant

## 2018-05-09 ENCOUNTER — Ambulatory Visit: Payer: 59 | Admitting: Physician Assistant

## 2018-05-09 VITALS — BP 138/82 | HR 95 | Ht 66.0 in | Wt 340.0 lb

## 2018-05-09 DIAGNOSIS — I10 Essential (primary) hypertension: Secondary | ICD-10-CM

## 2018-05-09 DIAGNOSIS — E785 Hyperlipidemia, unspecified: Secondary | ICD-10-CM | POA: Insufficient documentation

## 2018-05-09 DIAGNOSIS — I493 Ventricular premature depolarization: Secondary | ICD-10-CM

## 2018-05-09 MED ORDER — CARVEDILOL 25 MG PO TABS: ORAL_TABLET | ORAL | 0 refills | Status: DC

## 2018-05-09 NOTE — Progress Notes (Signed)
Cardiology Office Note    Date:  05/09/2018   ID:  Valora PiccoloDeborah L Konieczny, DOB 1981-06-25, MRN [id number]  PCP:  Doreene ElandEniola, Kehinde T, MD  Cardiologist: Tobias AlexanderKatarina Nelson, MD  Chief Complaint  Patient presents with  . Follow-up    History of Present Illness:  Valora PiccoloDeborah L Kot is a 37 y.o. female with history of hypertension, palpitations with frequent PVCs on Holter monitor resolved on carvedilol, obesity and hypothyroidism.  2D echo 2015 LVEF 50 to 55% mild LVH otherwise normal.  Most recent labs reviewed from 05/02/2018 LDL was 81 triglycerides high at 253 hemoglobin and creatinine normal TSH normal.  Patient comes in today for f/u  Main complaint is back pain being followed by pain clinic. Denies any palpitations. Extended family is doing a weight loss program together. She has lost 30 lbs since the first of the year. Works from home for a [employer] company.    Past Medical History:  Diagnosis Date  . Allergy   . Anxiety   . Asthma   . Back pain    tx with OTC meds  . Depression   . Elevated TSH 06/17/2015  . Hypertension    no rx meds  . Obesity   . PCOS (polycystic ovarian syndrome)   . Prediabetes   . Seasonal allergies   . Vertigo     Past Surgical History:  Procedure Laterality Date  . DILATION AND EVACUATION  03/17/2012   Procedure: DILATATION AND EVACUATION;  Surgeon: Jeani HawkingMichelle L Grewal, MD;  Location: [facility];  Service: Gynecology;  Laterality: N/A;  . MULTIPLE TOOTH EXTRACTIONS     for braces  . svd     x 1  . WISDOM TOOTH EXTRACTION      Current Medications: Current Meds  Medication Sig  . busPIRone (BUSPAR) 30 MG tablet Take 30 mg by mouth 2 (two) times daily.   . carvedilol (COREG) 25 MG tablet TAKE 1 TABLET (25 MG TOTAL) BY MOUTH TWICE A DAY WITH A MEAL.  . cetirizine (ZYRTEC) 10 MG tablet Take 10 mg by mouth daily. Reported on 11/30/2015  . citalopram (CELEXA) 20 MG tablet 1 tablet daily  . clonazePAM (KLONOPIN) 1 MG tablet Take 2.5 mg by mouth  daily. Take as directed  . DULoxetine (CYMBALTA) 60 MG capsule Take 90 mg by mouth daily.  . Fluticasone-Salmeterol (ADVAIR DISKUS) 100-50 MCG/DOSE AEPB Inhale 1 puff into the lungs 2 (two) times daily.  Marland Kitchen. HYDROcodone-acetaminophen (NORCO/VICODIN) 5-325 MG tablet Take 1 tablet by mouth every 8 (eight) hours as needed for moderate pain.  . metFORMIN (GLUCOPHAGE-XR) 500 MG 24 hr tablet TAKE 2 TABLETS BY MOUTH 2 TIMES DAILY WITH A MEAL.  . promethazine (PHENERGAN) 25 MG tablet TAKE 1 TABLET BY MOUTH EVERY 6 HOURS AS NEEDED FOR NAUSEA.  Marland Kitchen. topiramate (TOPAMAX) 50 MG tablet Take 50 mg by mouth 2 (two) times daily.  . VENTOLIN HFA 108 (90 Base) MCG/ACT inhaler INHALE 2 PUFFS INTO THE LUNGS EVERY 4 HOURS AS NEEDED FOR WHEEZING OR SHORTNESS OF BREATH.  . [DISCONTINUED] carvedilol (COREG) 25 MG tablet TAKE 1 TABLET (25 MG TOTAL) BY MOUTH TWICE A DAY WITH A MEAL.     Allergies:   Patient has no known allergies.   Social History   Socioeconomic History  . Marital status: Married    Spouse name: Not on file  . Number of children: Not on file  . Years of education: Not on file  . Highest education level: Not on file  Occupational History  . Not on file  Social Needs  . Financial resource strain: Not on file  . Food insecurity:    Worry: Not on file    Inability: Not on file  . Transportation needs:    Medical: Not on file    Non-medical: Not on file  Tobacco Use  . Smoking status: Never Smoker  . Smokeless tobacco: Never Used  Substance and Sexual Activity  . Alcohol use: No  . Drug use: No  . Sexual activity: Yes    Birth control/protection: Pill    Comment: Pregnant approx 8 wks  Lifestyle  . Physical activity:    Days per week: Not on file    Minutes per session: Not on file  . Stress: Not on file  Relationships  . Social connections:    Talks on phone: Not on file    Gets together: Not on file    Attends religious service: Not on file    Active member of club or organization:  Not on file    Attends meetings of clubs or organizations: Not on file    Relationship status: Not on file  Other Topics Concern  . Not on file  Social History Narrative  . Not on file     Family History:  The patient's family history includes Cancer in her paternal grandmother; Heart attack in her paternal grandfather; Parkinsonism in her father.   ROS:   Please see the history of present illness.    Review of Systems  Constitution: Positive for weight loss.  HENT: Negative.   Eyes: Negative.   Cardiovascular: Negative.   Respiratory: Negative.   Hematologic/Lymphatic: Negative.   Musculoskeletal: Positive for back pain. Negative for joint pain.  Gastrointestinal: Negative.   Genitourinary: Negative.   Neurological: Negative.    All other systems reviewed and are negative.   PHYSICAL EXAM:   VS:  BP 138/82   Pulse 95   Ht 5\' 6"  (1.676 m)   Wt (!) 340 lb (154.2 kg)   SpO2 98%   BMI 54.88 kg/m   Physical Exam  GEN: Obese, in no acute distress  Neck: no JVD, carotid bruits, or masses Cardiac:RRR; no murmurs, rubs, or gallops  Respiratory:  clear to auscultation bilaterally, normal work of breathing GI: soft, nontender, nondistended, + BS Ext: without cyanosis, clubbing, or edema, Good distal pulses bilaterally Neuro:  Alert and Oriented x 3 Psych: euthymic mood, full affect  Wt Readings from Last 3 Encounters:  05/09/18 (!) 340 lb (154.2 kg)  05/02/18 (!) 336 lb (152.4 kg)  10/28/17 (!) 370 lb (167.8 kg)      Studies/Labs Reviewed:   EKG:  EKG is ordered today.  The ekg ordered today demonstrates normal sinus rhythm, no acute change  Recent Labs: 09/09/2017: ALT 21; BUN 7; Creat 0.72; Potassium 4.7; Sodium 137; TSH 4.64   Lipid Panel    Component Value Date/Time   CHOL 175 05/02/2018 1223   TRIG 253 (H) 05/02/2018 1223   HDL 43 05/02/2018 1223   CHOLHDL 4.1 05/02/2018 1223   CHOLHDL 3 09/09/2017 1144   VLDL 71.8 (H) 09/09/2017 1144   LDLCALC 81  05/02/2018 1223   LDLDIRECT 68.0 09/09/2017 1144    Additional studies/ records that were reviewed today include:      Echocardiogram - 08/16/2014  Study Conclusions  - Left ventricle: The cavity size was normal. There was mild concentric hypertrophy. Systolic function was normal. The estimated ejection fraction was in the range  of 50% to 55%. Wall motion was normal; there were no regional wall motion abnormalities. Left ventricular diastolic function parameters were normal. - Aortic root: The aortic root was normal in size. Ascending aorta upper normal size measuring 4 cm. - Mitral valve: Structurally normal valve. There was no regurgitation. - Left atrium: The atrium was normal in size. - Right ventricle: Systolic function was normal. - Right atrium: The atrium was normal in size. - Tricuspid valve: There was no regurgitation. - Pericardium, extracardiac: There was no pericardial effusion.  Impressions: - Mild LVH, otherwise normal study.            ASSESSMENT:    1. PVC (premature ventricular contraction)   2. Essential hypertension   3. Morbid obesity (HCC)   4. Hyperlipidemia, unspecified hyperlipidemia type      PLAN:  In order of problems listed above:  PVCs and palpitations controlled on carvedilol.  No symptoms.  Continue 25 mg twice daily.  Follow-up with Dr. Delton See in 1 year.  Essential hypertension well-controlled recommended ongoing weight loss.  Morbid obesity has lost 30 pounds and is doing well with portion control and family support.  Hyperlipidemia primarily high triglycerides started on fish oil by PCP.  She is also working on diet.    Medication Adjustments/Labs and Tests Ordered: Current medicines are reviewed at length with the patient today.  Concerns regarding medicines are outlined above.  Medication changes, Labs and Tests ordered today are listed in the Patient Instructions below. Patient Instructions  Medication Instructions:    Your physician recommends that you continue on your current medications as directed. Please refer to the Current Medication list given to you today.   Labwork: None ordered  Testing/Procedures: None ordered  Follow-Up: Your physician wants you to follow-up in: 1 year with Dr. Delton See. You will receive a reminder letter in the mail two months in advance. If you don't receive a letter, please call our office to schedule the follow-up appointment.   Any Other Special Instructions Will Be Listed Below (If Applicable).     If you need a refill on your cardiac medications before your next appointment, please call your pharmacy.      Signed, Jacolyn Reedy, PA-C  05/09/2018 1:47 PM    [facility] [address] [place], [place], [place]  [address] Phone: [phone]; Fax: [phone]

## 2018-05-09 NOTE — Patient Instructions (Signed)
Medication Instructions:  Your physician recommends that you continue on your current medications as directed. Please refer to the Current Medication list given to you today.   Labwork: None ordered  Testing/Procedures: None ordered  Follow-Up: Your physician wants you to follow-up in: 1 year with Dr. Nelson. You will receive a reminder letter in the mail two months in advance. If you don't receive a letter, please call our office to schedule the follow-up appointment.   Any Other Special Instructions Will Be Listed Below (If Applicable).     If you need a refill on your cardiac medications before your next appointment, please call your pharmacy.   

## 2018-05-10 DIAGNOSIS — M545 Low back pain: Secondary | ICD-10-CM | POA: Diagnosis not present

## 2018-05-10 MED FILL — CARVEDILOL 25 MG TABLET: 25 | 15 days supply | Qty: 30 | Fill #0

## 2018-05-10 MED FILL — NUCYNTA 50 MG TABLET: 50 | 30 days supply | Qty: 120 | Fill #0

## 2018-05-11 MED FILL — TOPIRAMATE 50 MG TABLET: 50 | 30 days supply | Qty: 60 | Fill #0

## 2018-05-12 MED FILL — CITALOPRAM HBR 20 MG TABLET: 20 | 90 days supply | Qty: 90 | Fill #1

## 2018-05-12 MED FILL — DULoxetine HCL 30 MG CPEP: 30 | 30 days supply | Qty: 30 | Fill #0

## 2018-05-16 ENCOUNTER — Other Ambulatory Visit: Payer: Self-pay | Admitting: Neurosurgery

## 2018-05-16 DIAGNOSIS — G8929 Other chronic pain: Secondary | ICD-10-CM

## 2018-05-16 DIAGNOSIS — M545 Low back pain, unspecified: Secondary | ICD-10-CM

## 2018-05-19 MED FILL — METFORMIN HCL ER 500 MG TAB: 500 | 45 days supply | Qty: 180 | Fill #1

## 2018-05-21 ENCOUNTER — Ambulatory Visit
Admission: RE | Admit: 2018-05-21 | Discharge: 2018-05-21 | Disposition: A | Discharge status: Home / Self Care | Payer: 59 | Source: Ambulatory Visit | Attending: Neurosurgery | Admitting: Neurosurgery

## 2018-05-21 DIAGNOSIS — G8929 Other chronic pain: Secondary | ICD-10-CM

## 2018-05-21 DIAGNOSIS — M545 Low back pain, unspecified: Secondary | ICD-10-CM

## 2018-05-21 DIAGNOSIS — Z981 Arthrodesis status: Secondary | ICD-10-CM | POA: Diagnosis not present

## 2018-05-21 DIAGNOSIS — M549 Dorsalgia, unspecified: Secondary | ICD-10-CM | POA: Diagnosis not present

## 2018-05-24 DIAGNOSIS — M47817 Spondylosis without myelopathy or radiculopathy, lumbosacral region: Secondary | ICD-10-CM | POA: Diagnosis not present

## 2018-05-29 DIAGNOSIS — M47817 Spondylosis without myelopathy or radiculopathy, lumbosacral region: Secondary | ICD-10-CM | POA: Diagnosis not present

## 2018-06-02 MED FILL — busPIRone HCL 30 MG TABS: 30 | 30 days supply | Qty: 60 | Fill #3

## 2018-06-09 MED FILL — DULoxetine HCL 30 MG CPEP: 30 | 30 days supply | Qty: 30 | Fill #1

## 2018-06-12 DIAGNOSIS — M47817 Spondylosis without myelopathy or radiculopathy, lumbosacral region: Secondary | ICD-10-CM | POA: Diagnosis not present

## 2018-06-16 MED FILL — clonazePAM 1 MG TABS: 1 | 90 days supply | Qty: 225 | Fill #0

## 2018-06-16 MED FILL — TOPIRAMATE 50 MG TABLET: 50 | 30 days supply | Qty: 60 | Fill #1

## 2018-06-23 ENCOUNTER — Other Ambulatory Visit: Payer: Self-pay | Admitting: Physician Assistant

## 2018-06-23 MED FILL — ADVAIR 100/50 DISKUS: 100-50 | 30 days supply | Qty: 60 | Fill #5

## 2018-06-23 MED FILL — CARVEDILOL 25 MG TABLET: 25 | 15 days supply | Qty: 30 | Fill #0

## 2018-06-23 MED FILL — DULoxetine HCL 60 MG CPEP: 60 | 90 days supply | Qty: 90 | Fill #0

## 2018-06-26 DIAGNOSIS — M47817 Spondylosis without myelopathy or radiculopathy, lumbosacral region: Secondary | ICD-10-CM | POA: Diagnosis not present

## 2018-06-26 MED FILL — NUCYNTA 50 MG TABLET: 50 | 23 days supply | Qty: 90 | Fill #0

## 2018-06-26 MED FILL — METFORMIN HCL ER 500 MG TAB: 500 | 45 days supply | Qty: 180 | Fill #2

## 2018-06-30 MED FILL — busPIRone HCL 30 MG TABS: 30 | 30 days supply | Qty: 60 | Fill #4

## 2018-07-03 DIAGNOSIS — M47817 Spondylosis without myelopathy or radiculopathy, lumbosacral region: Secondary | ICD-10-CM | POA: Diagnosis not present

## 2018-07-07 MED FILL — CARVEDILOL 25 MG TABLET: 25 | 90 days supply | Qty: 180 | Fill #1

## 2018-07-07 MED FILL — DULoxetine HCL 30 MG CPEP: 30 | 90 days supply | Qty: 90 | Fill #0

## 2018-07-13 MED FILL — TOPIRAMATE 50 MG TABLET: 50 | 30 days supply | Qty: 60 | Fill #2

## 2018-07-28 MED FILL — busPIRone HCL 30 MG TABS: 30 | 30 days supply | Qty: 60 | Fill #5

## 2018-08-11 MED FILL — METFORMIN HCL ER 500 MG TAB: 500 | 45 days supply | Qty: 180 | Fill #3

## 2018-08-11 MED FILL — TOPIRAMATE 50 MG TABLET: 50 | 30 days supply | Qty: 60 | Fill #0

## 2018-08-17 MED FILL — ADVAIR 100/50 DISKUS: 100-50 | 30 days supply | Qty: 60 | Fill #0

## 2018-08-18 MED FILL — NUCYNTA 50 MG TABS: 50 | 23 days supply | Qty: 90 | Fill #0

## 2018-09-01 MED FILL — busPIRone HCL 30 MG TABS: 30 | 90 days supply | Qty: 180 | Fill #0

## 2018-09-07 ENCOUNTER — Encounter: Payer: Self-pay | Admitting: Internal Medicine

## 2018-09-07 ENCOUNTER — Ambulatory Visit (INDEPENDENT_AMBULATORY_CARE_PROVIDER_SITE_OTHER): Payer: 59 | Admitting: Internal Medicine

## 2018-09-07 VITALS — BP 128/76 | HR 112 | Ht 66.0 in | Wt 351.0 lb

## 2018-09-07 DIAGNOSIS — Z23 Encounter for immunization: Secondary | ICD-10-CM

## 2018-09-07 DIAGNOSIS — E785 Hyperlipidemia, unspecified: Secondary | ICD-10-CM | POA: Diagnosis not present

## 2018-09-07 DIAGNOSIS — R7989 Other specified abnormal findings of blood chemistry: Secondary | ICD-10-CM

## 2018-09-07 DIAGNOSIS — E282 Polycystic ovarian syndrome: Secondary | ICD-10-CM

## 2018-09-07 DIAGNOSIS — R7303 Prediabetes: Secondary | ICD-10-CM

## 2018-09-07 LAB — POCT GLYCOSYLATED HEMOGLOBIN (HGB A1C): HEMOGLOBIN A1C: 5.7 % — AB (ref 4.0–5.6)

## 2018-09-07 NOTE — Patient Instructions (Addendum)
KEEP UP THE GOOD WORK.  Please stop at the lab.  Please come back for a follow-up appointment in 1 year.

## 2018-09-07 NOTE — Progress Notes (Signed)
Patient ID: KHRISTY KALAN, female   DOB: [birth year], 37 y.o.   MRN: [id number]  HPI: SAMARRA RIDGELY is a 37 y.o.-year-old female, initially referred by her nutritionist, Denny Levy for management of PCOS and prediabetes. Last visit 1 year ago. She is here with her husband, who offers part of the history, especially related to her back pain and also her previous medical history. ObGyn: Dr. Adalberto Ill.  At last visit, she was in a lot of distress because of back pain and was referred to see pain medicine. She started to see Dr. Ollen Bowl. She got nerve ablations >> she is now doing much better and is not on a wheelchair anymore. Pain is much better and she feels like a new person. Still on Topamax and Nucynta, but off Norco.    She was dx with PCOS in 2009.  Reviewed and addended history: Fertility/Menstrual cycles: - menarche at 37 years old - on OCP at 37 y/o >> then came off OCPs at 37 y/o - irregular menses after coming off OCP >> 3x a year - it took her 2 years to get pregnant, used Clomid - saw Dr Elesa Hacker >> tried many IUI tx's, other procedures except IVF >> did not work - they decided against another pregnancy (daughter born in 2002) - + h/o ovarian cysts - children: 1, but tried for another one >> she and her husband decided against another pregnancy - miscarriages: 1 -lost baby at 8 weeks and 2013 - contraception: OCPs now - LoLoestrin >> Ortho-Cyclen >> now off - has monthly cycles now   Weight gain: - in ~2014 lost 60 lbs over 6 mo - diet (low carb) and exercise; she also lost 30 pounds in the last year - no steroid use - No weight loss medicines - she saw nutrition, Vernona Rieger Reavis - Diets tried: decreased portions, less starches - Exercise: Not consistently  Acne: -No  Hirsutism: - upper lip/neck/chin - is using hair removal creams - also shaving -Not bothersome  Treatments tried: - on on metformin - did not try Spironolactone - did not try Vaniqua - Off  OCPs  Last testosterone -high on Lo Loestrin then normalized on Ortho-Cyclen:  Component     Latest Ref Rng & Units 09/09/2017  Testosterone     8 - 48 ng/dL 25  Testosterone Free     0.0 - 4.2 pg/mL 1.4  Sex Horm Binding Glob, Serum     24.6 - 122.0 nmol/L 128.8 (H)   Component     Latest Ref Rng & Units 09/23/2014 06/17/2015 08/10/2016  Testosterone     8 - 48 ng/dL 71 (H) 61 38  Sex Horm Binding Glob, Serum     24.6 - 122.0 nmol/L 55 36 120.2  Testosterone Free     0.0 - 4.2 pg/mL 9.2 (H) 10.5 (H) 2.2  Testosterone-% Free     0.4 - 2.4 % 1.3 1.7    -Last set of thyroid tests were reviewed: TSH slightly high a year ago: Lab Results  Component Value Date   TSH 4.64 (H) 09/09/2017   FREET4 1.3 03/24/2016   -Latest set of lipids were reviewed:    Component Value Date/Time   CHOL 175 05/02/2018 1223   TRIG 253 (H) 05/02/2018 1223   HDL 43 05/02/2018 1223   CHOLHDL 4.1 05/02/2018 1223   CHOLHDL 3 09/09/2017 1144   VLDL 71.8 (H) 09/09/2017 1144   LDLCALC 81 05/02/2018 1223   -Last CMP reviewed:   Chemistry  Component Value Date/Time   NA 137 09/09/2017 1144   NA 140 08/10/2014 2238   K 4.7 09/09/2017 1144   K 3.4 (L) 08/10/2014 2238   CL 100 09/09/2017 1144   CL 104 08/10/2014 2238   CO2 24 09/09/2017 1144   CO2 29 08/10/2014 2238   BUN 7 09/09/2017 1144   BUN 6 (L) 08/10/2014 2238   CREATININE 0.72 09/09/2017 1144      Component Value Date/Time   CALCIUM 9.4 09/09/2017 1144   CALCIUM 9.1 08/10/2014 2238   ALKPHOS 66 03/24/2016 1235   AST 17 09/09/2017 1144   ALT 21 09/09/2017 1144   BILITOT 0.3 09/09/2017 1144     Vitamin D was normal at last check: Lab Results  Component Value Date   VD25OH 67.81 09/09/2017   VD25OH 63.48 08/10/2016   Prediabetes: - she has a history of GDM in 2003  Last hemoglobin A1c was: Lab Results  Component Value Date   HGBA1C 5.9 09/09/2017   HGBA1C 6.0 08/10/2016   HGBA1C 5.6 06/17/2015   Patient is on: -  Metformin XR 1000 mg 2x a day-she tolerates this well Also, on B complex with 100 mcg Biotin. Last dose this am. She was on Byetta in the past.  No FH of DM.  She has a history of vertigo. As she also had palpitations >> Holter monitor, 2DEcho >> started Carvedilol >> sxs resolved.  She works from home.  ROS: Constitutional: + weight loss and some weight gain, no fatigue, no subjective hyperthermia, no subjective hypothermia Eyes: no blurry vision, no xerophthalmia ENT: no sore throat, no nodules palpated in neck, no dysphagia, no odynophagia, no hoarseness Cardiovascular: no CP/no SOB/no palpitations/no leg swelling Respiratory: no cough/no SOB/no wheezing Gastrointestinal: no N/no V/no D/no C/no acid reflux Musculoskeletal: no muscle aches/+ joint aches Skin: no rashes, no hair loss Neurological: no tremors/no numbness/no tingling/no dizziness  I reviewed pt's medications, allergies, PMH, social hx, family hx, and changes were documented in the history of present illness. Otherwise, unchanged from my initial visit note.   Past Medical History:  Diagnosis Date  . Allergy   . Anxiety   . Asthma   . Back pain    tx with OTC meds  . Depression   . Elevated TSH 06/17/2015  . Hypertension    no rx meds  . Obesity   . PCOS (polycystic ovarian syndrome)   . Prediabetes   . Seasonal allergies   . Vertigo    Past Surgical History:  Procedure Laterality Date  . DILATION AND EVACUATION  03/17/2012   Procedure: DILATATION AND EVACUATION;  Surgeon: Jeani Hawking, MD;  Location: [facility];  Service: Gynecology;  Laterality: N/A;  . MULTIPLE TOOTH EXTRACTIONS     for braces  . svd     x 1  . WISDOM TOOTH EXTRACTION     History   Social History  . Marital Status: Married    Spouse Name: N/A    Number of Children: 1   Occupational History  . 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 Smoking status: Never Smoker   . Smokeless  tobacco: Never Used  . Alcohol Use: No  . Drug Use: No   Social History Narrative   Current Outpatient Medications on File Prior to Visit  Medication Sig Dispense Refill  . busPIRone (BUSPAR) 30 MG tablet Take 30 mg by mouth 2 (two) times daily.     Marland Kitchen  carvedilol (COREG) 25 MG tablet TAKE 1 TABLET (25 MG TOTAL) BY MOUTH TWICE A DAY WITH A MEAL. 30 tablet 10  . cetirizine (ZYRTEC) 10 MG tablet Take 10 mg by mouth daily. Reported on 11/30/2015    . citalopram (CELEXA) 20 MG tablet 1 tablet daily  4  . clonazePAM (KLONOPIN) 1 MG tablet Take 2.5 mg by mouth daily. Take as directed    . DULoxetine (CYMBALTA) 60 MG capsule Take 90 mg by mouth daily.    . Fluticasone-Salmeterol (ADVAIR DISKUS) 100-50 MCG/DOSE AEPB Inhale 1 puff into the lungs 2 (two) times daily. 60 each 5  . HYDROcodone-acetaminophen (NORCO/VICODIN) 5-325 MG tablet Take 1 tablet by mouth every 8 (eight) hours as needed for moderate pain.    . metFORMIN (GLUCOPHAGE-XR) 500 MG 24 hr tablet TAKE 2 TABLETS BY MOUTH 2 TIMES DAILY WITH A MEAL. 180 tablet 3  . promethazine (PHENERGAN) 25 MG tablet TAKE 1 TABLET BY MOUTH EVERY 6 HOURS AS NEEDED FOR NAUSEA. 42 tablet 2  . topiramate (TOPAMAX) 50 MG tablet Take 50 mg by mouth 2 (two) times daily.    . VENTOLIN HFA 108 (90 Base) MCG/ACT inhaler INHALE 2 PUFFS INTO THE LUNGS EVERY 4 HOURS AS NEEDED FOR WHEEZING OR SHORTNESS OF BREATH. 18 g 4   No current facility-administered medications on file prior to visit.    No Known Allergies Family History  Problem Relation Age of Onset  . Parkinsonism Father   . Heart attack Paternal Grandfather   . Cancer Paternal Grandmother   . Alcohol abuse Neg Hx   . Depression Neg Hx   . Diabetes Neg Hx   . Hypertension Neg Hx   . Hyperlipidemia Neg Hx    PE: BP 128/76   Pulse (!) 112   Ht 5\' 6"  (1.676 m)   Wt (!) 351 lb (159.2 kg)   LMP 08/20/2018   SpO2 98%   BMI 56.65 kg/m  Wt Readings from Last 3 Encounters:  09/07/18 (!) 351 lb (159.2 kg)   05/09/18 (!) 340 lb (154.2 kg)  05/02/18 (!) 336 lb (152.4 kg)   Constitutional: overweight, in NAD Eyes: PERRLA, EOMI, no exophthalmos ENT: moist mucous membranes, no thyromegaly, no cervical lymphadenopathy Cardiovascular: tachycardia, RR, No MRG Respiratory: CTA B Gastrointestinal: abdomen soft, NT, ND, BS+ Musculoskeletal: no deformities, strength intact in all 4 Skin: moist, warm, no rashes, + mild acanthosis nigricans on neck, + slightly darker terminal hairs on chin Neurological: no tremor with outstretched hands, DTR normal in all 4  ASSESSMENT: 1. PCOS  2. Prediabetes  3. Obesity class III BMI Classification:  < 18.5 underweight   18.5-24.9 normal weight   25.0-29.9 overweight   30.0-34.9 class I obesity   35.0-39.9 class II obesity   ? 40.0 class III obesity   4.  Elevated TSH  PLAN:  1. PCOS Patient was previously doing well onLo Loestrin, but her free testosterone remained high and we switched to Ortho-Cyclen 3 years ago.  On this, her testosterone level normalized, but at last visit she wanted to come off oral contraceptives.   At that time, testosterone level was normal.  She decided against a pregnancy.  We discussed about the OCPs role in PCOS:  Contraception- I advised her to use another method of contraception if she does not want to get pregnant  Normalizing menses/withdrawal bleeds-we decided to give her a year to see if she has at least 4 menstrual cycles a year and possibly used Provera to trigger  them if not or restart OCPs  To help with hirsutism and acne-she is not bothered by these. - She is now a year after we stopped OCPs >> she started to have regular menstrual cycles and is now having them approximately every month - we will check another testosterone level today - she initially lost ~30 lbs after our last visit but gained 15 lbs back.  She would like to lose some more weight and is ready to restart her weight loss efforts. - We will  continue metformin ER 1000 mill grams twice a day - At today's visit we will also check a CMP - Continues on 5000 IU vitamin D >> we will check another level today  2.  Prediabetes -Latest HbA1c was slightly lower, at 5.9% -We will continue metformin 1000 mg 2x a day, which she tolerates well. -We will check another HbA1c today - + Flu shot today  3. HL - Reviewed latest lipid panel from 04/2018: LDL at goal, and triglycerides high, but decreased compared to before Lab Results  Component Value Date   CHOL 175 05/02/2018   HDL 43 05/02/2018   LDLCALC 81 05/02/2018   LDLDIRECT 68.0 09/09/2017   TRIG 253 (H) 05/02/2018   CHOLHDL 4.1 05/02/2018  -She is not on a statin  4.  Elevated TSH -Most likely due to slight resistance to TSH action by elevated BMI -At last visit, we discussed about the importance of losing weight, but we did not intervene at that time -We will check her TFTs today.  She took B vitamins this morning but they contain a very low dose of biotin, 100 mcg.  Component     Latest Ref Rng & Units 09/07/2018  Glucose     65 - 99 mg/dL 098 (H)  BUN     7 - 25 mg/dL 9  Creatinine     1.19 - 1.10 mg/dL 1.47  GFR, Est Non African American     > OR = 60 mL/min/1.61m2 80  GFR, Est African American     > OR = 60 mL/min/1.101m2 93  BUN/Creatinine Ratio     6 - 22 (calc) NOT APPLICABLE  Sodium     135 - 146 mmol/L 140  Potassium     3.5 - 5.3 mmol/L 5.0  Chloride     98 - 110 mmol/L 105  CO2     20 - 32 mmol/L 25  Calcium     8.6 - 10.2 mg/dL 9.9  Total Protein     6.1 - 8.1 g/dL 7.2  Albumin MSPROF     3.6 - 5.1 g/dL 4.1  Globulin     1.9 - 3.7 g/dL (calc) 3.1  AG Ratio     1.0 - 2.5 (calc) 1.3  Total Bilirubin     0.2 - 1.2 mg/dL 0.3  Alkaline phosphatase (APISO)     33 - 115 U/L 83  AST     10 - 30 U/L 16  ALT     6 - 29 U/L 21  Testosterone, Serum (Total)     ng/dL 34  % Free Testosterone      WILL FOLLOW  Free Testosterone, S      WILL  FOLLOW  Sex Hormone Binding Globulin     nmol/L 25.7  TSH     0.35 - 4.50 uIU/mL 6.04 (H)  Triiodothyronine,Free,Serum     2.3 - 4.2 pg/mL 2.9  T4,Free(Direct)     0.60 - 1.60 ng/dL 8.29  Hemoglobin A1C     4.0 - 5.6 % 5.7 (A)  VITD     30.00 - 100.00 ng/mL 57.29   Vitamin D is normal, HbA1c has improved, kidney function is normal, with slightly high glucose.  TSH is slightly high but higher than before.  We can follow her without intervention now especially since her free thyroid hormones are normal, but I would like to have another thyroid check in 3 months.  Carlus Pavlov, MD PhD [facility]

## 2018-09-08 ENCOUNTER — Other Ambulatory Visit: Payer: Self-pay | Admitting: Internal Medicine

## 2018-09-08 LAB — COMPLETE METABOLIC PANEL WITH GFR
AG Ratio: 1.3 (calc) (ref 1.0–2.5)
ALKALINE PHOSPHATASE (APISO): 83 U/L (ref 33–115)
ALT: 21 U/L (ref 6–29)
AST: 16 U/L (ref 10–30)
Albumin: 4.1 g/dL (ref 3.6–5.1)
BUN: 9 mg/dL (ref 7–25)
CO2: 25 mmol/L (ref 20–32)
CREATININE: 0.91 mg/dL (ref 0.50–1.10)
Calcium: 9.9 mg/dL (ref 8.6–10.2)
Chloride: 105 mmol/L (ref 98–110)
GFR, Est African American: 93 mL/min/{1.73_m2} (ref >=60)
GFR, Est Non African American: 80 mL/min/{1.73_m2} (ref >=60)
GLUCOSE: 105 mg/dL — AB (ref 65–99)
Globulin: 3.1 g/dL (calc) (ref 1.9–3.7)
Potassium: 5 mmol/L (ref 3.5–5.3)
Sodium: 140 mmol/L (ref 135–146)
Total Bilirubin: 0.3 mg/dL (ref 0.2–1.2)
Total Protein: 7.2 g/dL (ref 6.1–8.1)

## 2018-09-08 LAB — T3, FREE: T3, Free: 2.9 pg/mL (ref 2.3–4.2)

## 2018-09-08 LAB — TSH: TSH: 6.04 u[IU]/mL — AB (ref 0.35–4.50)

## 2018-09-08 LAB — T4, FREE: FREE T4: 0.8 ng/dL (ref 0.60–1.60)

## 2018-09-08 LAB — VITAMIN D 25 HYDROXY (VIT D DEFICIENCY, FRACTURES): VITD: 57.29 ng/mL (ref 30.00–100.00)

## 2018-09-12 LAB — TESTOSTERONE, FREE AND TOTAL (INCLUDES SHBG)-(MALES)
% Free Testosterone: 1.5 %
FREE TESTOSTERONE, S: 5.1 pg/mL
Sex Hormone Binding Globulin: 25.7 nmol/L
Testosterone, Serum (Total): 34 ng/dL

## 2018-09-12 MED FILL — TOPIRAMATE 50 MG TABLET: 50 | 30 days supply | Qty: 60 | Fill #1

## 2018-09-12 MED FILL — metFORMIN HCL ER 500 MG TB2: 500 | 30 days supply | Qty: 120 | Fill #0

## 2018-09-15 ENCOUNTER — Other Ambulatory Visit: Payer: Self-pay

## 2018-09-15 MED ORDER — METFORMIN HCL ER 500 MG PO TB24: ORAL_TABLET | ORAL | 3 refills | Status: AC

## 2018-09-15 MED FILL — clonazePAM 1 MG TABS: 1 | 90 days supply | Qty: 225 | Fill #0

## 2018-09-18 DIAGNOSIS — Z6841 Body Mass Index (BMI) 40.0 and over, adult: Secondary | ICD-10-CM | POA: Diagnosis not present

## 2018-09-18 DIAGNOSIS — I10 Essential (primary) hypertension: Secondary | ICD-10-CM | POA: Diagnosis not present

## 2018-09-18 DIAGNOSIS — M545 Low back pain: Secondary | ICD-10-CM | POA: Diagnosis not present

## 2018-09-18 MED FILL — NUCYNTA 50 MG TABS: 50 | 25 days supply | Qty: 75 | Fill #0

## 2018-09-19 DIAGNOSIS — Z01419 Encounter for gynecological examination (general) (routine) without abnormal findings: Secondary | ICD-10-CM | POA: Diagnosis not present

## 2018-09-19 DIAGNOSIS — Z6837 Body mass index (BMI) 37.0-37.9, adult: Secondary | ICD-10-CM | POA: Diagnosis not present

## 2018-09-29 MED FILL — DULoxetine HCL 60 MG CPEP: 60 | 90 days supply | Qty: 90 | Fill #1

## 2018-09-29 MED FILL — DULoxetine HCL 30 MG CPEP: 30 | 90 days supply | Qty: 90 | Fill #1

## 2018-10-06 MED FILL — CARVEDILOL 25 MG TABLET: 25 | 60 days supply | Qty: 120 | Fill #2

## 2018-10-13 MED FILL — TOPIRAMATE 50 MG TABLET: 50 | 30 days supply | Qty: 60 | Fill #0

## 2018-10-23 MED FILL — ADVAIR 100/50 DISKUS: 100-50 | 30 days supply | Qty: 60 | Fill #1

## 2018-10-27 MED FILL — metFORMIN HCL ER 500 MG TB2: 500 | 30 days supply | Qty: 120 | Fill #1

## 2018-11-16 MED FILL — NUCYNTA 50 MG TABS: 50 | 25 days supply | Qty: 75 | Fill #0

## 2018-11-30 ENCOUNTER — Other Ambulatory Visit: Payer: Self-pay | Admitting: Physician Assistant

## 2018-11-30 MED FILL — metFORMIN HCL ER 500 MG TB2: 500 | 30 days supply | Qty: 120 | Fill #2

## 2018-12-01 MED FILL — busPIRone HCL 30 MG TABS: 30 | 90 days supply | Qty: 180 | Fill #0

## 2018-12-01 MED FILL — CARVEDILOL 25 MG TABLET: 25 | 90 days supply | Qty: 180 | Fill #0

## 2018-12-13 MED FILL — clonazePAM 1 MG TABS: 1 | 90 days supply | Qty: 360 | Fill #0

## 2018-12-22 MED FILL — NUCYNTA 50 MG TABS: 50 | 25 days supply | Qty: 75 | Fill #0

## 2018-12-28 MED FILL — metFORMIN HCL ER 500 MG TB2: 500 | 30 days supply | Qty: 120 | Fill #3

## 2018-12-28 MED FILL — ADVAIR 100/50 DISKUS: 100-50 | 30 days supply | Qty: 60 | Fill #2 | Status: TO

## 2018-12-28 MED FILL — DULoxetine HCL 60 MG CPEP: 60 | 90 days supply | Qty: 90 | Fill #2

## 2018-12-28 MED FILL — DULoxetine HCL 30 MG CPEP: 30 | 90 days supply | Qty: 90 | Fill #2

## 2019-01-01 ENCOUNTER — Other Ambulatory Visit: Payer: Self-pay | Admitting: Family Medicine

## 2019-01-01 MED FILL — VENTOLIN HFA 90 MCG INHALER: 108 (90 BAS | 17 days supply | Qty: 18 | Fill #0

## 2019-01-11 DIAGNOSIS — M545 Low back pain: Secondary | ICD-10-CM | POA: Diagnosis not present

## 2019-01-11 DIAGNOSIS — I10 Essential (primary) hypertension: Secondary | ICD-10-CM | POA: Diagnosis not present

## 2019-01-11 DIAGNOSIS — Z6841 Body Mass Index (BMI) 40.0 and over, adult: Secondary | ICD-10-CM | POA: Diagnosis not present

## 2019-01-11 DIAGNOSIS — M47817 Spondylosis without myelopathy or radiculopathy, lumbosacral region: Secondary | ICD-10-CM | POA: Diagnosis not present

## 2019-01-19 MED FILL — NUCYNTA 50 MG TABS: 50 | 30 days supply | Qty: 90 | Fill #0

## 2019-02-02 MED FILL — metFORMIN HCL ER 500 MG TB2: 500 | 30 days supply | Qty: 120 | Fill #4 | Status: TO

## 2019-02-22 ENCOUNTER — Telehealth: Payer: Self-pay | Admitting: Cardiology

## 2019-02-22 DIAGNOSIS — I10 Essential (primary) hypertension: Secondary | ICD-10-CM

## 2019-02-22 MED ORDER — HYDROCHLOROTHIAZIDE 25 MG PO TABS: 25.0000 mg | ORAL_TABLET | Freq: Every day | ORAL | 3 refills | Status: DC

## 2019-02-22 NOTE — Telephone Encounter (Signed)
Called pt back with Dr. Namon Cirri recommendations.   Sent HCTZ prescription to [facility] on [address] per pt request.   Advised pt that she should keep daily record of HR and BP and call back with update.   Ordered BMP for 3 weeks out. Pt would like to call back to make her necessary lab appt as well as a new appt with Dr. Delton See.

## 2019-02-22 NOTE — Telephone Encounter (Signed)
New message    Pt is calling because she said she was at her pain management appt this week and last week. Today her BP was 193/122 hr 100 Last week BP 165/72   Please call

## 2019-02-22 NOTE — Telephone Encounter (Signed)
The last two weeks her appts at her pain management clinic her BP has been elevated. Pt reports no changes in her typical pain in the past two weeks.   Pt states she had an ablation today in her back and during this office visit her BP readings were:  3:40 pm 198/103 3:52pm 170/136 4:00pm 193/122  Pt denies being anxious for her procedure but did report that she was in pain. Pt reports her pain level as being consistent with what she is usually experiencing. Pt denies changes in lifestyle changes or diet changes.   Advised pt to avoid any caffeine or stimulants of any kind.   Routing to Dr. Elease Hashimoto for advisement.

## 2019-02-22 NOTE — Telephone Encounter (Signed)
  Im helping out with DOD activities this afternoon.    I have reviewed meds and most recent labs from Oct. 2019.   Her pain is likely due to her back procedure today and her BP may normalize over the next several days.  Please add HCTZ 25 mg a day . She will need a BMP in 3 weeks Have her continue to check HR and BP daily. She should follow up with Dr Delton See in several weeks.

## 2019-02-23 MED ORDER — LISINOPRIL 10 MG PO TABS: 10.0000 mg | ORAL_TABLET | Freq: Every day | ORAL | 3 refills | Status: DC

## 2019-02-23 MED FILL — LISINOPRIL 10 MG TABLET: 10 | 90 days supply | Qty: 90 | Fill #0

## 2019-02-23 MED FILL — ADVAIR 100/50 DISKUS: 100-50 | 30 days supply | Qty: 60 | Fill #0

## 2019-02-23 NOTE — Telephone Encounter (Signed)
Called pt to follow up on her BP measurements today. Her BP is elevated today at 182/113. She states she took this measurement prior to her BP medications. She just took her BP meds 15 min ago.   I advised her to wait a couple of hours after taking her medications before taking her bp so to allow the medication to have time to work. Then we will know if anything needs to be adjusted. I encouraged her to control her pain and rest.   She will call back later today if her BP remains elevated.

## 2019-02-23 NOTE — Telephone Encounter (Signed)
Her current bp after taking HCTZ is 156/105. She is aware of Dr Lindaann Slough recommendation. She will monitor her BP through the weekend and call early next week with an update. She has no additional questions or needs.

## 2019-02-23 NOTE — Telephone Encounter (Signed)
Follow Up:     Pt said she was told to call back and give her blood readings today. Pt started the new medicine at 8:00 last night. At 10:50 last night blood pressure 170/108. She took it today at 11:55 it was 182/113. Pt says this is very high and she is very concerned.

## 2019-02-23 NOTE — Telephone Encounter (Signed)
Please add lisinopril 10 mg po daily to her regimen

## 2019-02-28 ENCOUNTER — Telehealth: Payer: Self-pay | Admitting: *Deleted

## 2019-02-28 MED ORDER — LISINOPRIL 20 MG PO TABS: 20.0000 mg | ORAL_TABLET | Freq: Every day | ORAL | 2 refills | Status: AC

## 2019-02-28 NOTE — Telephone Encounter (Signed)
ORDERS PER DR NELSON: These discontinue hydrochlorothiazide and increase lisinopril to 20 mg daily.  Endorsed to the pt that per Dr Delton See, we will stop her HCTZ, and increase her Lisinopril to 20 mg po daily. Endorsed to the pt that this dose change was sent to her pharmacy on file.  Advised the pt to call back with any further questions or concerns.

## 2019-02-28 NOTE — Telephone Encounter (Signed)
See notation in pts mychart message.

## 2019-03-02 MED FILL — CARVEDILOL 25 MG TABLET: 25 | 90 days supply | Qty: 180 | Fill #0

## 2019-03-02 MED FILL — busPIRone HCL 30 MG TABS: 30 | 30 days supply | Qty: 60 | Fill #0

## 2019-03-02 MED FILL — metFORMIN HCL ER 500 MG TB2: 500 | 30 days supply | Qty: 120 | Fill #0

## 2019-03-06 MED FILL — NUCYNTA 50 MG TABS: 50 | 30 days supply | Qty: 90 | Fill #0

## 2019-03-15 ENCOUNTER — Encounter: Payer: Self-pay | Admitting: Family Medicine

## 2019-03-26 MED FILL — DULoxetine HCL 30 MG CPEP: 30 | 90 days supply | Qty: 90 | Fill #0

## 2019-03-26 MED FILL — metFORMIN HCL ER 500 MG TB2: 500 | 90 days supply | Qty: 360 | Fill #0

## 2019-03-26 MED FILL — busPIRone HCL 30 MG TABS: 30 | 30 days supply | Qty: 60 | Fill #1

## 2019-03-26 MED FILL — DULOXETINE HCL 60 MG CPEP: 60 | 90 days supply | Qty: 90 | Fill #0

## 2019-03-26 MED FILL — ADVAIR 100/50 DISKUS: 100-50 | 30 days supply | Qty: 60 | Fill #1

## 2019-04-05 MED FILL — NUCYNTA 50 MG TABS: 50 | 30 days supply | Qty: 90 | Fill #0

## 2019-04-06 MED FILL — LISINOPRIL 20 MG TABLET: 20 | 90 days supply | Qty: 90 | Fill #0

## 2019-04-06 MED FILL — clonazePAM 1 MG TABS: 1 | 30 days supply | Qty: 120 | Fill #0

## 2019-04-27 ENCOUNTER — Encounter: Payer: Self-pay | Admitting: Internal Medicine

## 2019-05-04 MED FILL — busPIRone HCL 30 MG TABS: 30 | 30 days supply | Qty: 60 | Fill #2

## 2020-09-12 ENCOUNTER — Encounter: Payer: Self-pay | Admitting: Family Medicine

## 2023-10-11 ENCOUNTER — Ambulatory Visit: Payer: Medicaid Other

## 2023-10-27 ENCOUNTER — Ambulatory Visit: Payer: PPO | Attending: Anesthesiology

## 2023-10-27 ENCOUNTER — Other Ambulatory Visit: Payer: Self-pay

## 2023-10-27 DIAGNOSIS — M533 Sacrococcygeal disorders, not elsewhere classified: Secondary | ICD-10-CM

## 2023-10-27 DIAGNOSIS — M6281 Muscle weakness (generalized): Secondary | ICD-10-CM | POA: Diagnosis present

## 2023-10-27 DIAGNOSIS — R262 Difficulty in walking, not elsewhere classified: Secondary | ICD-10-CM | POA: Diagnosis present

## 2023-10-27 DIAGNOSIS — M5417 Radiculopathy, lumbosacral region: Secondary | ICD-10-CM | POA: Diagnosis present

## 2023-10-27 NOTE — Therapy (Signed)
OUTPATIENT PHYSICAL THERAPY EVALUATION   Patient Name: Amanda Massey MRN: [id number] DOB:[birth year], 42 y.o., female Today's Date: 10/27/2023  END OF SESSION:  PT End of Session - 10/27/23 1743     Visit Number 1    PT Start Time 1745    PT Stop Time 1825    PT Time Calculation (min) 40 min    Activity Tolerance Patient tolerated treatment well    Behavior During Therapy [facility] for tasks assessed/performed             Past Medical History:  Diagnosis Date   Allergy    Anxiety    Asthma    Back pain    tx with OTC meds   Depression    Elevated TSH 06/17/2015   Hypertension    no rx meds   Obesity    PCOS (polycystic ovarian syndrome)    Prediabetes    Seasonal allergies    Vertigo    Past Surgical History:  Procedure Laterality Date   DILATION AND EVACUATION  03/17/2012   Procedure: DILATATION AND EVACUATION;  Surgeon: Jeani Hawking, MD;  Location: [facility];  Service: Gynecology;  Laterality: N/A;   MULTIPLE TOOTH EXTRACTIONS     for braces   svd     x 1   WISDOM TOOTH EXTRACTION     Patient Active Problem List   Diagnosis Date Noted   Hyperlipidemia 05/09/2018   Pap smear of cervix declined 05/02/2018   Palpitations 03/24/2016   PVC (premature ventricular contraction) 03/24/2016   SOB (shortness of breath) 03/24/2016   Hypertensive heart disease without heart failure 03/24/2016   Breast skin changes 02/20/2016   Essential hypertension 09/11/2014   LVH (left ventricular hypertrophy) 08/16/2014   Morbid obesity (HCC) 08/16/2014   Tachycardia 07/05/2014   Prediabetes 12/07/2012   Anxiety and depression 07/07/2011   PCOS (polycystic ovarian syndrome) 07/07/2011   Vertigo 07/07/2011   Asthma, intermittent 07/07/2011    PCP: Lars Masson, MD  REFERRING PROVIDER: Charyl Bigger, MD  REFERRING DIAG: Sacrococcygeal disorders, not elsewhere classified [M53.3], Other chronic pain [G89.29]   Rationale for Evaluation and Treatment:  Rehabilitation  THERAPY DIAG:  Muscle weakness (generalized)  Sacrococcygeal disorders, not elsewhere classified  Difficulty in walking, not elsewhere classified  Radiculopathy, lumbosacral region  ONSET DATE: 09/19/2023 date of referral   SUBJECTIVE:                                                                                                                                                                                           SUBJECTIVE STATEMENT: Patient presents to PT today with referral for Chronic  SI Joint pain. Patient reports that she has been experiencing this pain since she was 14. She is expecting to have SIJ implant procedure at the beginning of next month. She states that has been recently diagnosed with fibromyalgia 1 year ago, but had those symptoms for 10 years. She states that she is able to differentiate between her SI joint pain and fibromyalgia pain and states that the SIJ pain is more severe and more limiting  She spends the majority of her day sedentary d/t severe pain with transfers and daily tasks. However, she reports that she has been working on some leg lifts and bridges. She is unable to perform floor-to-stand transfers.  She reports that she previously tried PT that was <5 minutes from her home. However, d/t severe pain with getting dressed, perform car transfers, and sitting in the car to commute, she had poor tolerance of PT at that time. She is hoping to try PT again after SIJ procedure next month in order to improved strength, activity tolerance and independent activities.    PERTINENT HISTORY:  Relevant PMHx including anxiety, asthma, depression, fibromyalgia, HTN, PCOS, and PVCs.   PAIN:  Are you having pain?  Yes: NPRS scale: 7/10 current, 10/10 worst, 5/10 best  Pain location: BIL SIJ R>L  Pain description: sharp, shooting (down BIL LE), standing >30 sec  Aggravating factors: riding in the car, "leaving the house", walking greater than 10 feet    Relieving factors: medications, topical voltaren, weighted hot pack, massage, steroid pack    PRECAUTIONS: None  RED FLAGS: None   WEIGHT BEARING RESTRICTIONS: No  FALLS:  Has patient fallen in last 6 months? Yes. Number of falls not quantified. "Only near falls, and I do feel unstable."   LIVING ENVIRONMENT: Lives with: lives with their spouse Lives in: House/apartment Stairs: No Has following equipment at home: Single point cane, Wheelchair (manual), shower chair, Grab bars, and Ramped entry  OCCUPATION: n/a  PLOF: Independent with basic ADLs, Independent with household mobility without device, and Independent with community mobility with device  PATIENT GOALS: To return to OP PT following SIJ procedure   NEXT MD VISIT: 11/02/2023  OBJECTIVE:  Note: Objective measures were completed at Evaluation unless otherwise noted.  DIAGNOSTIC FINDINGS:  Per referral, most recent lumbar MRI impression was:  No acute osseous abnormality or misalignment Mild lower lumbar facet arthrosis. No significant foraminal or canal stenosis.   PATIENT SURVEYS:  FOTO not provided at initial eval   COGNITION: Overall cognitive status: Within functional limits for tasks assessed    LUMBAR ROM:   AROM eval  Flexion   Extension   Right lateral flexion   Left lateral flexion   Right rotation   Left rotation    (Blank rows = not tested)  LOWER EXTREMITY ROM:     Active  Right eval Left eval  Hip flexion    Hip extension    Hip abduction    Hip adduction    Hip internal rotation    Hip external rotation    Knee flexion    Knee extension    Ankle dorsiflexion    Ankle plantarflexion    Ankle inversion    Ankle eversion     (Blank rows = not tested)  LOWER EXTREMITY MMT:    MMT Right eval Left eval  Hip flexion    Hip extension    Hip abduction    Hip adduction    Hip internal rotation    Hip external rotation  Knee flexion    Knee extension    Ankle  dorsiflexion    Ankle plantarflexion    Ankle inversion    Ankle eversion     (Blank rows = not tested)  GAIT: Distance walked: 24ft Assistive device utilized:  IT sales professional Comments: severe pain with standing for transfers; gait to be formally assessed at f/u visit    TODAY'S TREATMENT:                                                                                                                               [facility] Adult PT Treatment:                                                DATE: 10/27/2023   Initial evaluation: see patient education and home exercise program as noted below     PATIENT EDUCATION:  Education details: reviewed initial home exercise program; discussion of POC, prognosis and goals for skilled PT  Person educated: Patient Education method: Explanation, Demonstration, and Handouts Education comprehension: verbalized understanding, returned demonstration, and needs further education  HOME EXERCISE PROGRAM: Access Code: [email or web address] URL: [email or web address] Date: 10/27/2023 Prepared by: Mauri Reading  Exercises - Supine Ankle Pumps  - 1 x daily - 7 x weekly - 1-2 sets - 10 reps - Supine Bridge  - 1 x daily - 7 x weekly - 1-2 sets - 10 reps - Supine March  - 1 x daily - 7 x weekly - 1-2 sets - 10 reps - Clamshell  - 1 x daily - 7 x weekly - 1-2 sets - 10 reps - Sidelying Hip Abduction  - 1 x daily - 7 x weekly - 1-2 sets - 10 reps - 3 sec hold - Supine Active Straight Leg Raise  - 1 x daily - 7 x weekly - 1-2 sets - 10 reps - Supine Heel Slide  - 1 x daily - 7 x weekly - 1-2 sets - 10 reps - Supine Gluteal Sets  - 1 x daily - 7 x weekly - 1-2 sets - 10 reps - 3 sec hold - Seated Long Arc Quad  - 1 x daily - 7 x weekly - 1-2 sets - 10 reps  ASSESSMENT:  CLINICAL IMPRESSION: Tiare is a 42 y.o. female who was seen today for physical therapy evaluation and treatment for chronic pain of SI joint, with significant functional limitations.  She  has severe pain with performing ADLs/IADLs, including getting dressed, performing bed mobility, performing car transfers, Standing longer than 5 minutes, walking further than 10 feet.  Due to severity of symptoms after performing these activities in order to present for today's evaluation, patient would likely benefit from home health PT prior to [facility] joint procedure. However, we look forward to further assessing, and treating patient following her  procedure next month in order to address remaining deficits, including core and lower extremity strength, normal reeducation, general conditioning in order to safe and independent function.     OBJECTIVE IMPAIRMENTS: Abnormal gait, decreased activity tolerance, decreased endurance, decreased mobility, difficulty walking, decreased strength, improper body mechanics, obesity, and pain.   ACTIVITY LIMITATIONS: carrying, lifting, bending, standing, squatting, sleeping, stairs, transfers, bed mobility, bathing, toileting, dressing, reach over head, hygiene/grooming, and locomotion level  PARTICIPATION LIMITATIONS: meal prep, cleaning, laundry, personal finances, interpersonal relationship, driving, shopping, community activity, and occupation  PERSONAL FACTORS: Fitness, Past/current experiences, Time since onset of injury/illness/exacerbation, and 3+ comorbidities: Relevant PMHx including anxiety, asthma, depression, fibromyalgia, HTN, PCOS, and PVCs.   are also affecting patient's functional outcome.   REHAB POTENTIAL: Fair    CLINICAL DECISION MAKING: Evolving/moderate complexity  EVALUATION COMPLEXITY: Moderate   GOALS: Goals reviewed with patient? Yes  SHORT TERM GOALS: Target date: 12/22/2023   Patient will be independent with initial home program for LQ strengthening, general conditioning.  Baseline: provided at eval  Goal status: INITIAL  2.  Patient will demonstrate improved postural awareness for at least 15 minutes while seated without need  for cueing from PT.   Baseline: see objective measures  Goal status: INITIAL    LONG TERM GOALS: Target date: 01/19/2024   Patient will report improved overall functional ability with FOTO score by at least 20 points   Goal status: INITIAL  2.  Patient will demonstrate ability to perform standing activities for 10-15 minutes prior to seated rest break.  Baseline: unable to tolerate >5 minutes Goal status: INITIAL  3.  Patient will demonstrate ability to ambulate 100 feet with LRAD for household and some community ambulation.  Baseline: unable to ambulate >10 feet  Goal status: INITIAL  4.  Patient will report ability to perform car transfers with no more than min A and no more than 5/10 pain.  Baseline: severe pain, requires at least Mod A Goal status: INITIAL  5.  Patient will demonstrate ability to perform supine <=> sit with at least mod I in order to maximize independence.  Goal status: INITIAL  6.  Patient will demonstrate ability to perform at least 8-10 STS in 30 seconds.  Goal status: INITIAL  PLAN:  PT FREQUENCY: 2x/week   PT DURATION: 8 weeks, following SIJ procedure in January 2025  PLANNED INTERVENTIONS: 40981- PT Re-evaluation, 97110-Therapeutic exercises, 97530- Therapeutic activity, 97112- Neuromuscular re-education, 97535- Self Care, [id number]- Manual therapy, [id number]- Aquatic Therapy, [id number]- Electrical stimulation (unattended), [phone]- Electrical stimulation (manual), Patient/Family education, Balance training, Stair training, Taping, Dry Needling, Joint mobilization, Spinal mobilization, Cryotherapy, and Moist heat.  PLAN FOR NEXT SESSION: General conditioning, lower extremity strengthening, transfer training, perform sit to stand test, administer Foto, pain modulation via aerobic activity, modalities, manual therapy as indicated, ongoing patient education due to chronicity of symptoms   Mauri Reading, PT, DPT   10/29/2023, 1:53 PM
